# Patient Record
Sex: Female | Born: 1990 | Race: Black or African American | Hispanic: No | Marital: Single | Smoking: Never smoker
Health system: Southern US, Community
[De-identification: ages and names within clinical notes are randomized; demographics above are authoritative.]

## PROBLEM LIST (undated history)

## (undated) DIAGNOSIS — G43909 Migraine, unspecified, not intractable, without status migrainosus: Secondary | ICD-10-CM

## (undated) DIAGNOSIS — Z862 Personal history of diseases of the blood and blood-forming organs and certain disorders involving the immune mechanism: Secondary | ICD-10-CM

## (undated) DIAGNOSIS — F419 Anxiety disorder, unspecified: Secondary | ICD-10-CM

## (undated) DIAGNOSIS — E559 Vitamin D deficiency, unspecified: Secondary | ICD-10-CM

## (undated) DIAGNOSIS — N920 Excessive and frequent menstruation with regular cycle: Secondary | ICD-10-CM

## (undated) DIAGNOSIS — N946 Dysmenorrhea, unspecified: Secondary | ICD-10-CM

## (undated) DIAGNOSIS — R945 Abnormal results of liver function studies: Secondary | ICD-10-CM

## (undated) DIAGNOSIS — R7989 Other specified abnormal findings of blood chemistry: Secondary | ICD-10-CM

## (undated) DIAGNOSIS — D649 Anemia, unspecified: Secondary | ICD-10-CM

## (undated) DIAGNOSIS — R5381 Other malaise: Secondary | ICD-10-CM

## (undated) DIAGNOSIS — Z713 Dietary counseling and surveillance: Secondary | ICD-10-CM

## (undated) DIAGNOSIS — R5383 Other fatigue: Secondary | ICD-10-CM

## (undated) DIAGNOSIS — R002 Palpitations: Secondary | ICD-10-CM

## (undated) DIAGNOSIS — E669 Obesity, unspecified: Secondary | ICD-10-CM

## (undated) HISTORY — DX: Personal history of diseases of the blood and blood-forming organs and certain disorders involving the immune mechanism: Z86.2

## (undated) HISTORY — DX: Anemia, unspecified: D64.9

## (undated) HISTORY — DX: Obesity, unspecified: E66.9

## (undated) HISTORY — DX: Dysmenorrhea, unspecified: N94.6

## (undated) HISTORY — DX: Excessive and frequent menstruation with regular cycle: N92.0

## (undated) HISTORY — DX: Migraine, unspecified, not intractable, without status migrainosus: G43.909

## (undated) HISTORY — DX: Vitamin D deficiency, unspecified: E55.9

## (undated) HISTORY — DX: Palpitations: R00.2

## (undated) HISTORY — DX: Dietary counseling and surveillance: Z71.3

## (undated) HISTORY — DX: Other malaise: R53.81

## (undated) HISTORY — DX: Other specified abnormal findings of blood chemistry: R79.89

## (undated) HISTORY — DX: Abnormal results of liver function studies: R94.5

## (undated) HISTORY — DX: Anxiety disorder, unspecified: F41.9

## (undated) HISTORY — DX: Other fatigue: R53.83

## (undated) HISTORY — PX: OTHER SURGICAL HISTORY: SHX169

## (undated) NOTE — *Deleted (*Deleted)
I value your feedback and entrusting us with your care. If you get a [facility] patient survey, I would appreciate you taking the time to let us know about your experience today. Thank you!  As of October 24, 2019, your lab results will be released to your MyChart immediately, before I even have a chance to see them. Please give me time to review them and contact you if there are any abnormalities. Thank you for your patience.  

## (undated) NOTE — *Deleted (*Deleted)
I value your feedback and entrusting us with your care. If you get a [facility] patient survey, I would appreciate you taking the time to let us know about your experience today. Thank you!  As of October 24, 2019, your lab results will be released to your MyChart immediately, before I even have a chance to see them. Please give me time to review them and contact you if there are any abnormalities. Thank you for your patience.  

---

## 2011-11-26 ENCOUNTER — Emergency Department: Payer: Self-pay | Admitting: Emergency Medicine

## 2012-04-30 ENCOUNTER — Ambulatory Visit: Payer: Self-pay | Admitting: Oncology

## 2012-05-14 ENCOUNTER — Ambulatory Visit: Payer: Self-pay | Admitting: Oncology

## 2012-06-11 LAB — CBC CANCER CENTER
Bands: 1 %
Eosinophil: 3 %
HCT: 36.3 % (ref 35.0–47.0)
HGB: 11.7 g/dL — ABNORMAL LOW (ref 12.0–16.0)
Lymphocytes: 44 %
MCH: 24.7 pg — ABNORMAL LOW (ref 26.0–34.0)
MCHC: 32.2 g/dL (ref 32.0–36.0)
MCV: 77 fL — ABNORMAL LOW (ref 80–100)
Monocytes: 5 %
Platelet: 235 x10 3/mm (ref 150–440)
RBC: 4.72 10*6/uL (ref 3.80–5.20)
RDW: 27 % — ABNORMAL HIGH (ref 11.5–14.5)
Segmented Neutrophils: 45 %
Variant Lymphocyte: 2 %
WBC: 4.1 x10 3/mm (ref 3.6–11.0)

## 2012-06-11 LAB — FERRITIN: Ferritin (ARMC): 105 ng/mL (ref 8–388)

## 2012-06-11 LAB — IRON AND TIBC
Iron Bind.Cap.(Total): 281 ug/dL (ref 250–450)
Iron Saturation: 19 %
Iron: 53 ug/dL (ref 50–170)
Unbound Iron-Bind.Cap.: 228 ug/dL

## 2012-06-11 LAB — FOLATE: Folic Acid: 10.3 ng/mL (ref 3.1–100.0)

## 2012-06-11 LAB — RETICULOCYTES
Absolute Retic Count: 0.0378 10*6/uL (ref 0.023–0.096)
Reticulocyte: 0.8 % (ref 0.5–2.2)

## 2012-06-11 LAB — LACTATE DEHYDROGENASE: LDH: 139 U/L (ref 81–234)

## 2012-06-13 LAB — PROT IMMUNOELECTROPHORES(ARMC)

## 2012-06-14 ENCOUNTER — Ambulatory Visit: Payer: Self-pay | Admitting: Oncology

## 2012-09-11 ENCOUNTER — Ambulatory Visit: Payer: Self-pay | Admitting: Oncology

## 2012-09-11 LAB — CBC CANCER CENTER
Basophil #: 0 x10 3/mm (ref 0.0–0.1)
Basophil %: 0.6 %
Eosinophil #: 0.1 x10 3/mm (ref 0.0–0.7)
Eosinophil %: 1.4 %
HCT: 36.8 % (ref 35.0–47.0)
HGB: 11.6 g/dL — ABNORMAL LOW (ref 12.0–16.0)
Lymphocyte #: 2 x10 3/mm (ref 1.0–3.6)
Lymphocyte %: 42.1 %
MCH: 26.7 pg (ref 26.0–34.0)
MCHC: 31.5 g/dL — ABNORMAL LOW (ref 32.0–36.0)
MCV: 85 fL (ref 80–100)
Monocyte #: 0.4 x10 3/mm (ref 0.2–0.9)
Monocyte %: 7.7 %
Neutrophil #: 2.2 x10 3/mm (ref 1.4–6.5)
Neutrophil %: 48.2 %
Platelet: 186 x10 3/mm (ref 150–440)
RBC: 4.34 10*6/uL (ref 3.80–5.20)
RDW: 13.2 % (ref 11.5–14.5)
WBC: 4.7 x10 3/mm (ref 3.6–11.0)

## 2012-09-11 LAB — IRON AND TIBC
Iron Bind.Cap.(Total): 265 ug/dL (ref 250–450)
Iron Saturation: 32 %
Iron: 86 ug/dL (ref 50–170)
Unbound Iron-Bind.Cap.: 179 ug/dL

## 2012-09-11 LAB — FERRITIN: Ferritin (ARMC): 52 ng/mL (ref 8–388)

## 2012-09-14 ENCOUNTER — Ambulatory Visit: Payer: Self-pay | Admitting: Oncology

## 2012-12-15 ENCOUNTER — Ambulatory Visit: Payer: Self-pay | Admitting: Oncology

## 2013-07-18 ENCOUNTER — Ambulatory Visit: Payer: Self-pay | Admitting: Oncology

## 2013-07-18 LAB — CBC CANCER CENTER
Basophil #: 0.1 x10 3/mm (ref 0.0–0.1)
Basophil %: 0.8 %
Eosinophil #: 0.1 x10 3/mm (ref 0.0–0.7)
Eosinophil %: 0.9 %
HCT: 34.3 % — ABNORMAL LOW (ref 35.0–47.0)
HGB: 10.9 g/dL — ABNORMAL LOW (ref 12.0–16.0)
Lymphocyte #: 2.2 x10 3/mm (ref 1.0–3.6)
Lymphocyte %: 35.1 %
MCH: 24.1 pg — ABNORMAL LOW (ref 26.0–34.0)
MCHC: 31.8 g/dL — ABNORMAL LOW (ref 32.0–36.0)
MCV: 76 fL — ABNORMAL LOW (ref 80–100)
Monocyte #: 0.6 x10 3/mm (ref 0.2–0.9)
Monocyte %: 8.8 %
Neutrophil #: 3.4 x10 3/mm (ref 1.4–6.5)
Neutrophil %: 54.4 %
Platelet: 252 x10 3/mm (ref 150–440)
RBC: 4.54 10*6/uL (ref 3.80–5.20)
RDW: 15 % — ABNORMAL HIGH (ref 11.5–14.5)
WBC: 6.3 x10 3/mm (ref 3.6–11.0)

## 2013-07-18 LAB — FERRITIN: Ferritin (ARMC): 4 ng/mL — ABNORMAL LOW (ref 8–388)

## 2013-07-18 LAB — IRON AND TIBC
Iron Bind.Cap.(Total): 416 ug/dL (ref 250–450)
Iron Saturation: 15 %
Iron: 61 ug/dL (ref 50–170)
Unbound Iron-Bind.Cap.: 355 ug/dL

## 2013-08-14 ENCOUNTER — Ambulatory Visit: Payer: Self-pay | Admitting: Oncology

## 2014-03-21 LAB — HM PAP SMEAR: HM Pap smear: NORMAL

## 2014-03-27 LAB — LIPID PANEL
Cholesterol: 142 mg/dL (ref 0–200)
HDL: 66 mg/dL (ref 35–70)
LDL Cholesterol: 64 mg/dL
Triglycerides: 61 mg/dL (ref 40–160)

## 2014-04-01 ENCOUNTER — Ambulatory Visit: Payer: Self-pay | Admitting: Oncology

## 2014-04-14 ENCOUNTER — Ambulatory Visit: Payer: Self-pay | Admitting: Oncology

## 2014-07-08 ENCOUNTER — Encounter: Payer: Self-pay | Admitting: Cardiovascular Disease

## 2014-07-08 ENCOUNTER — Ambulatory Visit (INDEPENDENT_AMBULATORY_CARE_PROVIDER_SITE_OTHER): Payer: 59 | Admitting: Cardiovascular Disease

## 2014-07-08 VITALS — BP 110/80 | HR 59 | Ht 63.0 in | Wt 169.5 lb

## 2014-07-08 DIAGNOSIS — R002 Palpitations: Secondary | ICD-10-CM | POA: Insufficient documentation

## 2014-07-08 DIAGNOSIS — I471 Supraventricular tachycardia, unspecified: Secondary | ICD-10-CM

## 2014-07-08 DIAGNOSIS — R0602 Shortness of breath: Secondary | ICD-10-CM

## 2014-07-08 DIAGNOSIS — R079 Chest pain, unspecified: Secondary | ICD-10-CM

## 2014-07-08 DIAGNOSIS — R000 Tachycardia, unspecified: Secondary | ICD-10-CM

## 2014-07-08 NOTE — Patient Instructions (Signed)
You are likely having an atrial tachycardia or supraventricular tachycardia No medication changes were made.  We will order a holter monitor for palpitations, tachycardia We will call you with the results Please call the office if symptoms get worse  Please call us if you have new issues that need to be addressed before your next appt.

## 2014-07-08 NOTE — Assessment & Plan Note (Signed)
As above, etiology of her symptoms is not clear. Did suggest she talk with Dr. Carlynn Purl to see if there might be an alternative medication for birth control that she could take. Uncertain if this is playing any role but she does report it is the only medication she is taking and it is relatively new in the past several months.

## 2014-07-08 NOTE — Assessment & Plan Note (Signed)
Etiology of her symptoms is not clear. Unable to exclude atrial tachycardia,  sinus tachycardia, SVT. Symptoms are very short lived. Holter monitor has been ordered. We did discuss the difficulty in managing her symptoms as symptoms are very short-lived, blood pressure running low, heart rate running low which would make it difficult to take a beta blocker on a daily basis. We'll wait for the results of the Holter first before recommending any medication.

## 2014-07-08 NOTE — Progress Notes (Signed)
   Patient ID: Bethany Marshall, female    DOB: 11-30-1990, 23 y.o.   MRN: [id number]  HPI Comments: Bethany Marshall is a very pleasant 23 year old woman with history of menorrhagia, recently placed on birth control medication with improvement of her symptoms, iron deficiency anemia, low vitamin D who presents by referral from Dr. Carlynn Marshall for symptoms of palpitations/tachycardia.  She reports that symptoms started 3-4 months ago. She is uncertain if the birth control pill was started prior to her onset of symptoms. She has had increasing frequency of tachycardia. It will last for 3-5 seconds at a time, happens now at least twice per day. Typically unrelated to activities. She reports it is a very fast/rapid rhythm that occurs most commonly in the daytime, less at night. She denies any heavy caffeine use or other herbal supplements. No other new medications.  Denies any chest pain or shortness of breath with exertion. She works out at [employer] on a regular basis with no symptoms.  EKG shows normal sinus rhythm with rate 62 beats per minute, sinus arrhythmia noted TSH 1.5, vitamin D of 13 and, serum iron of 15 with ferritin of 7, total cholesterol 142, hematocrit 32   Outpatient Encounter Prescriptions as of 07/08/2014  Medication Sig  . desogestrel-ethinyl estradiol (ORTHO-CEPT, 28,) 0.15-30 MG-MCG tablet Take 1 tablet by mouth daily.  . naproxen (NAPROSYN) 500 MG tablet Take 500 mg by mouth 2 (two) times daily with a meal.  . Vitamin D, Ergocalciferol, (DRISDOL) 50000 UNITS CAPS capsule Take 50,000 Units by mouth every 7 (seven) days.     Review of Systems  Constitutional: Negative.   HENT: Negative.   Eyes: Negative.   Respiratory: Negative.   Cardiovascular: Positive for palpitations.       Episodes of tachycardia  Gastrointestinal: Negative.   Endocrine: Negative.   Musculoskeletal: Negative.   Skin: Negative.   Allergic/Immunologic: Negative.   Neurological: Negative.   Hematological: Negative.    Psychiatric/Behavioral: Negative.   All other systems reviewed and are negative.   BP 110/80  Pulse 59  Ht  (1.6 m)  Wt 169 lb 8 oz (76.885 kg)  BMI 30.03 kg/m2  Physical Exam  Nursing note and vitals reviewed. Constitutional: She is oriented to person, place, and time. She appears well-developed and well-nourished.  HENT:  Head: Normocephalic.  Nose: Nose normal.  Mouth/Throat: Oropharynx is clear and moist.  Eyes: Conjunctivae are normal. Pupils are equal, round, and reactive to light.  Neck: Normal range of motion. Neck supple. No JVD present.  Cardiovascular: Normal rate, regular rhythm, S1 normal, S2 normal, normal heart sounds and intact distal pulses.  Exam reveals no gallop and no friction rub.   No murmur heard. Pulmonary/Chest: Effort normal and breath sounds normal. No respiratory distress. She has no wheezes. She has no rales. She exhibits no tenderness.  Abdominal: Soft. Bowel sounds are normal. She exhibits no distension. There is no tenderness.  Musculoskeletal: Normal range of motion. She exhibits no edema and no tenderness.  Lymphadenopathy:    She has no cervical adenopathy.  Neurological: She is alert and oriented to person, place, and time. Coordination normal.  Skin: Skin is warm and dry. No rash noted. No erythema.  Psychiatric: She has a normal mood and affect. Her behavior is normal. Judgment and thought content normal.    Assessment and Plan

## 2014-07-18 DIAGNOSIS — R002 Palpitations: Secondary | ICD-10-CM

## 2014-07-18 DIAGNOSIS — R000 Tachycardia, unspecified: Secondary | ICD-10-CM

## 2014-07-25 ENCOUNTER — Ambulatory Visit: Payer: Self-pay | Admitting: Oncology

## 2014-08-21 ENCOUNTER — Telehealth: Payer: Self-pay

## 2014-08-21 NOTE — Telephone Encounter (Signed)
Reviewed results of pt's holter monitor w/ her:  "NSR w/ periods of sinus tachycardia.   Likely a normal heart rate response to exertion. No symptoms w/ diary entry."  Pt verbalizes understanding and will call back w/ any questions or concerns.

## 2014-09-17 ENCOUNTER — Other Ambulatory Visit: Payer: Self-pay

## 2014-09-17 ENCOUNTER — Encounter (INDEPENDENT_AMBULATORY_CARE_PROVIDER_SITE_OTHER): Payer: 59

## 2014-09-17 DIAGNOSIS — R079 Chest pain, unspecified: Secondary | ICD-10-CM

## 2014-09-17 DIAGNOSIS — R0602 Shortness of breath: Secondary | ICD-10-CM

## 2014-09-17 DIAGNOSIS — R002 Palpitations: Secondary | ICD-10-CM

## 2014-09-17 DIAGNOSIS — R000 Tachycardia, unspecified: Secondary | ICD-10-CM

## 2014-11-24 ENCOUNTER — Emergency Department: Payer: Self-pay | Admitting: Emergency Medicine

## 2015-06-19 ENCOUNTER — Emergency Department
Admission: EM | Admit: 2015-06-19 | Discharge: 2015-06-20 | Disposition: A | Discharge status: Home / Self Care | Payer: 59 | Attending: Emergency Medicine | Admitting: Emergency Medicine

## 2015-06-19 DIAGNOSIS — Y998 Other external cause status: Secondary | ICD-10-CM | POA: Diagnosis not present

## 2015-06-19 DIAGNOSIS — Z23 Encounter for immunization: Secondary | ICD-10-CM | POA: Diagnosis not present

## 2015-06-19 DIAGNOSIS — W452XXA Lid of can entering through skin, initial encounter: Secondary | ICD-10-CM | POA: Diagnosis not present

## 2015-06-19 DIAGNOSIS — S61211A Laceration without foreign body of left index finger without damage to nail, initial encounter: Secondary | ICD-10-CM | POA: Diagnosis present

## 2015-06-19 DIAGNOSIS — Y9389 Activity, other specified: Secondary | ICD-10-CM | POA: Diagnosis not present

## 2015-06-19 DIAGNOSIS — Y9289 Other specified places as the place of occurrence of the external cause: Secondary | ICD-10-CM | POA: Insufficient documentation

## 2015-06-19 MED ORDER — TETANUS-DIPHTH-ACELL PERTUSSIS 5-2.5-18.5 LF-MCG/0.5 IM SUSP
0.5000 mL | Freq: Once | INTRAMUSCULAR | Status: AC
  Administered 2015-06-19: 0.5 mL via INTRAMUSCULAR
  Filled 2015-06-19: qty 0.5

## 2015-06-19 MED ORDER — OXYCODONE-ACETAMINOPHEN 5-325 MG PO TABS
1.0000 | ORAL_TABLET | Freq: Once | ORAL | Status: AC
  Administered 2015-06-19: 1 via ORAL

## 2015-06-19 MED ORDER — LIDOCAINE HCL (PF) 1 % IJ SOLN
5.0000 mL | Freq: Once | INTRAMUSCULAR | Status: AC
Start: 2015-06-19 — End: 2015-06-19
  Administered 2015-06-19: 5 mL
  Filled 2015-06-19: qty 5

## 2015-06-19 MED ORDER — OXYCODONE-ACETAMINOPHEN 5-325 MG PO TABS
ORAL_TABLET | ORAL | Status: AC
  Filled 2015-06-19: qty 1

## 2015-06-19 NOTE — ED Provider Notes (Signed)
CSN: [id number]     Arrival date & time 06/19/15  1944 History   First MD Initiated Contact with Patient 06/19/15 2259     Chief Complaint  Patient presents with  . Extremity Laceration     (Consider location/radiation/quality/duration/timing/severity/associated sxs/prior Treatment) HPI  24 year old female with laceration to the left index finger on the volar aspect of the middle phalanx. Patient was opening a can of green beans when the lid cut her left index finger. Laceration occurred just prior to arrival. Pain is mild. She denies any numbness or tingling. No loss of range of motion. Her tetanus is not up-to-date. No other injuries.  Past Medical History  Diagnosis Date  . Menorrhagia   . Anemia   . Vitamin D deficiency   . Anemia   . Migraine   . Dysmenorrhea    Past Surgical History  Procedure Laterality Date  . Iron fusion     Family History  Problem Relation Age of Onset  . Hypertension Father    History  Substance Use Topics  . Smoking status: Never Smoker   . Smokeless tobacco: Not on file  . Alcohol Use: Yes     Comment: Rare    OB History    No data available     Review of Systems  Constitutional: Negative for fever, chills, activity change and fatigue.  HENT: Negative for congestion, sinus pressure and sore throat.   Eyes: Negative for visual disturbance.  Respiratory: Negative for cough, chest tightness and shortness of breath.   Cardiovascular: Negative for chest pain and leg swelling.  Gastrointestinal: Negative for nausea, vomiting, abdominal pain and diarrhea.  Genitourinary: Negative for dysuria.  Musculoskeletal: Negative for arthralgias and gait problem.  Skin: Positive for wound. Negative for rash.  Neurological: Negative for weakness, numbness and headaches.  Hematological: Negative for adenopathy.  Psychiatric/Behavioral: Negative for behavioral problems, confusion and agitation.      Allergies  Review of patient's allergies indicates  no known allergies.  Home Medications   Prior to Admission medications   Medication Sig Start Date End Date Taking? Authorizing Provider  desogestrel-ethinyl estradiol (ORTHO-CEPT, 28,) 0.15-30 MG-MCG tablet Take 1 tablet by mouth daily.    Historical Provider, MD  naproxen (NAPROSYN) 500 MG tablet Take 500 mg by mouth 2 (two) times daily with a meal.    Historical Provider, MD  Vitamin D, Ergocalciferol, (DRISDOL) 50000 UNITS CAPS capsule Take 50,000 Units by mouth every 7 (seven) days.    Historical Provider, MD   BP 122/86 mmHg  Pulse 96  Temp(Src) 98.4 F (36.9 C) (Oral)  Resp 20  Ht 5\' 4"  (1.626 m)  Wt 175 lb (79.379 kg)  BMI 30.02 kg/m2  SpO2 97% Physical Exam  Constitutional: She is oriented to person, place, and time. She appears well-developed and well-nourished. No distress.  HENT:  Head: Normocephalic and atraumatic.  Mouth/Throat: Oropharynx is clear and moist.  Eyes: EOM are normal. Pupils are equal, round, and reactive to light. Right eye exhibits no discharge. Left eye exhibits no discharge.  Neck: Normal range of motion. Neck supple.  Cardiovascular: Normal rate, regular rhythm and intact distal pulses.   Pulmonary/Chest: Effort normal and breath sounds normal. No respiratory distress. She exhibits no tenderness.  Abdominal: Soft. She exhibits no distension. There is no tenderness.  Musculoskeletal: Normal range of motion. She exhibits no edema.  Left index finger with full flexion and extension. Sensation is intact. There is a transverse laceration along the volar aspect of the distal  middle phalanx. No foreign body seen.  Neurological: She is alert and oriented to person, place, and time. She has normal reflexes.  Skin: Skin is warm and dry.  Psychiatric: She has a normal mood and affect. Her behavior is normal. Thought content normal.    ED Course  Procedures (including critical care time) LACERATION REPAIR Performed by: Patience Musca Authorized  by: Patience Musca Consent: Verbal consent obtained. Risks and benefits: risks, benefits and alternatives were discussed Consent given by: patient Patient identity confirmed: provided demographic data Prepped and Draped in normal sterile fashion Wound explored  Laceration Location: Left index finger   Laceration Length: 2 cm  No Foreign Bodies seen or palpated  Anesthesia: local infiltration  Local anesthetic: lidocaine 1 % w/out epinephrine  Anesthetic total: 4 ml  Irrigation method: syringe Amount of cleaning: standard  Skin closure: 5-0 Nylon  Number of sutures: 5  Technique: Simple interrupted   Patient tolerance: Patient tolerated the procedure well with no immediate complications. Labs Review Labs Reviewed - No data to display  Imaging Review No results found.   EKG Interpretation None      MDM   Final diagnoses:  Laceration of left index finger w/o foreign body w/o damage to nail, initial encounter   24 year old female with simple laceration to left index finger. NVI Left upper extremity. No tendon deficits noted. Tetanus updated. Follow up with [facility] acute care in 7 days for suture removal    Evon Slack, PA-C 06/19/15 2342  Arnaldo Natal, MD 06/20/15 [phone]

## 2015-06-19 NOTE — Discharge Instructions (Signed)

## 2015-06-19 NOTE — ED Notes (Signed)
Pt to ED c/o laceration to L index finger.

## 2015-07-06 ENCOUNTER — Emergency Department
Admission: EM | Admit: 2015-07-06 | Discharge: 2015-07-06 | Disposition: A | Discharge status: Home / Self Care | Payer: 59 | Attending: Emergency Medicine | Admitting: Emergency Medicine

## 2015-07-06 ENCOUNTER — Encounter: Payer: Self-pay | Admitting: *Deleted

## 2015-07-06 DIAGNOSIS — Z4802 Encounter for removal of sutures: Secondary | ICD-10-CM | POA: Insufficient documentation

## 2015-07-06 DIAGNOSIS — Z793 Long term (current) use of hormonal contraceptives: Secondary | ICD-10-CM | POA: Insufficient documentation

## 2015-07-06 DIAGNOSIS — Z791 Long term (current) use of non-steroidal anti-inflammatories (NSAID): Secondary | ICD-10-CM | POA: Insufficient documentation

## 2015-07-06 NOTE — ED Notes (Signed)
Pt discharged home after verbalizing understanding of discharge instructions; nad noted. 

## 2015-07-06 NOTE — ED Notes (Signed)
Suture removal from left index finger

## 2015-07-06 NOTE — Discharge Instructions (Signed)

## 2015-07-06 NOTE — ED Provider Notes (Signed)
[facility] Emergency Department Provider Note  ____________________________________________  Time seen: On arrival  I have reviewed the triage vital signs and the nursing notes.   HISTORY  Chief Complaint Suture / Staple Removal  HPI Bethany Marshall is a 24 y.o. female who presents for suture removal. Patient had left index finger sutured approximately 2 weeks ago she was out of town so she has not presented for removal until today. Wound is well-healed and she has no complaints    Past Medical History  Diagnosis Date  . Menorrhagia   . Anemia   . Vitamin D deficiency   . Anemia   . Migraine   . Dysmenorrhea     Patient Active Problem List   Diagnosis Date Noted  . Palpitations 07/08/2014  . Paroxysmal supraventricular tachycardia 07/08/2014    Past Surgical History  Procedure Laterality Date  . Iron fusion      Current Outpatient Rx  Name  Route  Sig  Dispense  Refill  . desogestrel-ethinyl estradiol (ORTHO-CEPT, 28,) 0.15-30 MG-MCG tablet   Oral   Take 1 tablet by mouth daily.         . naproxen (NAPROSYN) 500 MG tablet   Oral   Take 500 mg by mouth 2 (two) times daily with a meal.         . Vitamin D, Ergocalciferol, (DRISDOL) 50000 UNITS CAPS capsule   Oral   Take 50,000 Units by mouth every 7 (seven) days.           Allergies Review of patient's allergies indicates no known allergies.  Family History  Problem Relation Age of Onset  . Hypertension Father     Social History Social History  Substance Use Topics  . Smoking status: Never Smoker   . Smokeless tobacco: None  . Alcohol Use: Yes     Comment: Rare     Review of Systems  Constitutional: Negative for fever.   Skin: Negative for rash.    ____________________________________________   PHYSICAL EXAM:  VITAL SIGNS: ED Triage Vitals  Enc Vitals Group     BP --      Pulse --      Resp --      Temp 07/06/15 1743 97.5 F (36.4 C)     Temp src --       SpO2 --      Weight --      Height --      Head Cir --      Peak Flow --      Pain Score --      Pain Loc --      Pain Edu? --      Excl. in GC? --      Constitutional: Alert and oriented. Well appearing and in no distress. Eyes: Conjunctivae are normal.  ENT   Head: Normocephalic and atraumatic.   Mouth/Throat: Mucous membranes are moist. Cardiovascular: Normal rate, regular rhythm.  Respiratory: Normal respiratory effort without tachypnea nor retractions.  Gastrointestinal: Soft and non-tender in all quadrants. No distention. There is no CVA tenderness. Musculoskeletal: Nontender with normal range of motion in all extremities. Neurologic:  Normal speech and language. No gross focal neurologic deficits are appreciated. Skin:  Skin is warm, dry and intact. No rash noted. Psychiatric: Mood and affect are normal. Patient exhibits appropriate insight and judgment.  ____________________________________________    LABS (pertinent positives/negatives)  Labs Reviewed - No data to display  ____________________________________________     ____________________________________________  RADIOLOGY I have personally reviewed any xrays that were ordered on this patient: None  ____________________________________________   PROCEDURES  Procedure(s) performed: 5 sutures removed without difficulty   ____________________________________________   INITIAL IMPRESSION / ASSESSMENT AND PLAN / ED COURSE  Pertinent labs & imaging results that were available during my care of the patient were reviewed by me and considered in my medical decision making (see chart for details).  Well-healed wound, sutures removed  ____________________________________________   FINAL CLINICAL IMPRESSION(S) / ED DIAGNOSES  Final diagnoses:  Visit for suture removal     Jene Every, MD 07/06/15 1845

## 2015-12-03 ENCOUNTER — Ambulatory Visit: Payer: Self-pay | Admitting: Family Medicine

## 2015-12-08 ENCOUNTER — Encounter: Payer: Self-pay | Admitting: Family Medicine

## 2015-12-08 ENCOUNTER — Ambulatory Visit (INDEPENDENT_AMBULATORY_CARE_PROVIDER_SITE_OTHER): Payer: PRIVATE HEALTH INSURANCE | Admitting: Family Medicine

## 2015-12-08 VITALS — BP 118/68 | HR 107 | Temp 98.3°F | Resp 18 | Ht 64.0 in | Wt 202.0 lb

## 2015-12-08 DIAGNOSIS — E669 Obesity, unspecified: Secondary | ICD-10-CM

## 2015-12-08 DIAGNOSIS — Z862 Personal history of diseases of the blood and blood-forming organs and certain disorders involving the immune mechanism: Secondary | ICD-10-CM

## 2015-12-08 DIAGNOSIS — R7989 Other specified abnormal findings of blood chemistry: Secondary | ICD-10-CM | POA: Diagnosis not present

## 2015-12-08 DIAGNOSIS — Z131 Encounter for screening for diabetes mellitus: Secondary | ICD-10-CM | POA: Diagnosis not present

## 2015-12-08 DIAGNOSIS — Z13220 Encounter for screening for lipoid disorders: Secondary | ICD-10-CM | POA: Diagnosis not present

## 2015-12-08 DIAGNOSIS — R945 Abnormal results of liver function studies: Secondary | ICD-10-CM | POA: Insufficient documentation

## 2015-12-08 DIAGNOSIS — R1033 Periumbilical pain: Secondary | ICD-10-CM

## 2015-12-08 DIAGNOSIS — R5383 Other fatigue: Secondary | ICD-10-CM

## 2015-12-08 DIAGNOSIS — E559 Vitamin D deficiency, unspecified: Secondary | ICD-10-CM | POA: Diagnosis not present

## 2015-12-08 DIAGNOSIS — Z113 Encounter for screening for infections with a predominantly sexual mode of transmission: Secondary | ICD-10-CM

## 2015-12-08 DIAGNOSIS — Z23 Encounter for immunization: Secondary | ICD-10-CM

## 2015-12-08 DIAGNOSIS — R002 Palpitations: Secondary | ICD-10-CM | POA: Insufficient documentation

## 2015-12-08 DIAGNOSIS — G43009 Migraine without aura, not intractable, without status migrainosus: Secondary | ICD-10-CM | POA: Insufficient documentation

## 2015-12-08 DIAGNOSIS — N920 Excessive and frequent menstruation with regular cycle: Secondary | ICD-10-CM | POA: Insufficient documentation

## 2015-12-08 DIAGNOSIS — N946 Dysmenorrhea, unspecified: Secondary | ICD-10-CM | POA: Insufficient documentation

## 2015-12-08 NOTE — Progress Notes (Signed)
Name: Bethany Marshall   MRN: [id number]    DOB: 10-20-1991   Date:12/08/2015       Progress Note  Subjective  Chief Complaint  Chief Complaint  Patient presents with  . Follow-up    Patient just has a new sexual partner and would like to be checked for STD's, denies any symptoms  . Abdominal Pain    HPI  Abdominal pain  : she has noticed right periumbilical  pain described as sharp, intense 8-10/10, that can last a couple of hours and resolves by itself. It is associated with nausea but no vomiting or diarrhea. Episodes  started 6 months and were occasional, however over the past two months episodes have been more frequent, occurring a few times a week. No associated fever, no change in bowel movement, she is not sure if related to meals. She has a history of elevated LFT's but never diagnosed with fatty liver. She states cycles are not as heavy but just as long and regular, LMP: 11/15/2015  History of heavy cycles and iron deficiency: cycles have not been very heavy, but still lasts 5-6 days, she stopped ocps. She is still taking iron supplementation, she has to see Hematologist in the past for iron infusion.   Obesity: she has gained 27 lbs in the past 5 months, she still goes to the gym, but usually only lifting, she states she drinks Herbal life usually for dinner, having cereal and eggs for breakfast, she is trying to eat fruit but only one serving per day. She has been feeling tired   STI screen: she is homosexual, she is in a new relationship, past 4 months and would like to be checked for STI's. She denies any rashes or vaginal symptoms, no dysuria, she has not been using any sexual toys.   Patient Active Problem List   Diagnosis Date Noted  . Dysmenorrhea 12/08/2015  . Migraine without aura and responsive to treatment 12/08/2015  . Awareness of heartbeats 12/08/2015  . Vitamin D deficiency 12/08/2015  . Abnormal LFTs 12/08/2015  . H/O iron deficiency anemia 12/08/2015  . Excessive  and frequent menstruation 12/08/2015  . Palpitations 07/08/2014  . Paroxysmal supraventricular tachycardia (HCC) 07/08/2014    Past Surgical History  Procedure Laterality Date  . Iron fusion      Family History  Problem Relation Age of Onset  . Hypertension Father   . Hypothyroidism Mother     Social History   Social History  . Marital Status: Single    Spouse Name: N/A  . Number of Children: N/A  . Years of Education: N/A   Occupational History  . Not on file.   Social History Main Topics  . Smoking status: Never Smoker   . Smokeless tobacco: Never Used  . Alcohol Use: 0.0 oz/week    0 Standard drinks or equivalent per week     Comment: Rare   . Drug Use: No  . Sexual Activity: Yes   Other Topics Concern  . Not on file   Social History Narrative     Current outpatient prescriptions:  .  cholecalciferol (VITAMIN D) 1000 units tablet, Take 1,000 Units by mouth daily., Disp: , Rfl:  .  ferrous sulfate 325 (65 FE) MG tablet, Take by mouth., Disp: , Rfl:   No Known Allergies   ROS  Ten systems reviewed and is negative except as mentioned in HPI   Objective  Filed Vitals:   12/08/15 1512  BP: 118/68  Pulse: 107  Temp: 98.3 F (36.8 C)  TempSrc: Oral  Resp: 18  Height:  (1.626 m)  Weight: 202 lb (91.627 kg)  SpO2: 98%    Body mass index is 34.66 kg/(m^2).  Physical Exam  Constitutional: Patient appears well-developed and well-nourished. Obese  No distress.  HEENT: head atraumatic, normocephalic, pupils equal and reactive to light, neck supple, throat within normal limits Cardiovascular: Normal rate, regular rhythm and normal heart sounds.  No murmur heard. No BLE edema. Pulmonary/Chest: Effort normal and breath sounds normal. No respiratory distress. Abdominal: Soft.  There is a palpable mass on periumbilical area - right side, hard to touch, and caused pain during exam. No hernias. No hepatomegaly  Psychiatric: Patient has a normal mood  and affect. behavior is normal. Judgment and thought content normal.  PHQ2/9: Depression screen PHQ 2/9 12/08/2015  Decreased Interest 0  Down, Depressed, Hopeless 0  PHQ - 2 Score 0     Fall Risk: Fall Risk  12/08/2015  Falls in the past year? No      Functional Status Survey: Is the patient deaf or have difficulty hearing?: No Does the patient have difficulty seeing, even when wearing glasses/contacts?: Yes (glasses) Does the patient have difficulty concentrating, remembering, or making decisions?: No Does the patient have difficulty walking or climbing stairs?: No Does the patient have difficulty dressing or bathing?: No Does the patient have difficulty doing errands alone such as visiting a doctor's office or shopping?: No   Assessment & Plan  1. Periumbilical  pain  - Comprehensive metabolic panel - CBC with Differential/Platelet - US Abdomen and pelvis - periumbilical pain with palpable mass  2. Needs flu shot  - Flu Vaccine QUAD 36+ mos PF IM (Fluarix & Fluzone Quad PF) -refused  3. Vitamin D deficiency  - VITAMIN D 25 Hydroxy (Vit-D Deficiency, Fractures)  4. Abnormal LFTs  - Comprehensive metabolic panel  5. H/O iron deficiency anemia  - Iron - Ferritin - CBC with Differential/Platelet  6. Other fatigue  - CBC with Differential/Platelet - TSH - Vitamin B12  7. Routine screening for STI (sexually transmitted infection)  - Chlamydia/Gonococcus/Trichomonas, NAA - HIV antibody - RPR  8. Screening for diabetes mellitus  - Hemoglobin A1c  9. Lipid panel  - Lipid panel

## 2015-12-09 ENCOUNTER — Other Ambulatory Visit: Payer: Self-pay | Admitting: Family Medicine

## 2015-12-10 ENCOUNTER — Other Ambulatory Visit: Payer: Self-pay | Admitting: Family Medicine

## 2015-12-11 ENCOUNTER — Other Ambulatory Visit: Payer: Self-pay | Admitting: Family Medicine

## 2015-12-11 ENCOUNTER — Telehealth: Payer: Self-pay | Admitting: Family Medicine

## 2015-12-11 DIAGNOSIS — R1033 Periumbilical pain: Secondary | ICD-10-CM

## 2015-12-11 DIAGNOSIS — R945 Abnormal results of liver function studies: Secondary | ICD-10-CM

## 2015-12-11 DIAGNOSIS — R7989 Other specified abnormal findings of blood chemistry: Secondary | ICD-10-CM

## 2015-12-11 DIAGNOSIS — R1900 Intra-abdominal and pelvic swelling, mass and lump, unspecified site: Secondary | ICD-10-CM

## 2015-12-11 LAB — CHLAMYDIA/GONOCOCCUS/TRICHOMONAS, NAA
Chlamydia by NAA: NEGATIVE
Gonococcus by NAA: NEGATIVE
Trich vag by NAA: NEGATIVE

## 2015-12-11 NOTE — Telephone Encounter (Signed)
I called patient and she states she has not had any of the blood work due to having a bill at [employer] [facility] [employer]. I informed patient to try the hospital or [facility] labs instead. Also [facility] is faxing [place] patient urine results now could you please print them once they come and send them to Dr. Carlynn Purl. Thanks!

## 2015-12-11 NOTE — Telephone Encounter (Signed)
Pt wants to know if her STD test has came back yet? Please advise pt.

## 2015-12-15 ENCOUNTER — Other Ambulatory Visit: Payer: Self-pay | Admitting: Family Medicine

## 2015-12-15 ENCOUNTER — Ambulatory Visit
Admission: RE | Admit: 2015-12-15 | Discharge: 2015-12-15 | Disposition: A | Discharge status: Home / Self Care | Payer: PRIVATE HEALTH INSURANCE | Source: Ambulatory Visit | Attending: Family Medicine | Admitting: Family Medicine

## 2015-12-15 DIAGNOSIS — R1033 Periumbilical pain: Secondary | ICD-10-CM

## 2015-12-15 DIAGNOSIS — R7989 Other specified abnormal findings of blood chemistry: Secondary | ICD-10-CM | POA: Insufficient documentation

## 2015-12-15 DIAGNOSIS — R945 Abnormal results of liver function studies: Secondary | ICD-10-CM

## 2015-12-15 DIAGNOSIS — R1900 Intra-abdominal and pelvic swelling, mass and lump, unspecified site: Secondary | ICD-10-CM

## 2015-12-15 DIAGNOSIS — R1031 Right lower quadrant pain: Secondary | ICD-10-CM

## 2015-12-15 DIAGNOSIS — J90 Pleural effusion, not elsewhere classified: Secondary | ICD-10-CM

## 2015-12-21 ENCOUNTER — Telehealth: Payer: Self-pay

## 2015-12-21 NOTE — Telephone Encounter (Signed)
Patient called wanting to know her results. After she verified her date of birth, results were reviewed and patient was informed that she needed to have a chest x-ray and another ultrasound so her ovaries can be viewed since they were not looked at the first time.  Spoke with Sheralyn Boatman @ 9:49am with scheduling. Patient is scheduled to have both ultrasounds on Friday, December 25, 2015 @ 2:30pm at [address]  Patient was informed of visit and the prep (start drinking 1hr before and be finished within and do not void).

## 2015-12-25 ENCOUNTER — Ambulatory Visit
Admission: RE | Admit: 2015-12-25 | Discharge: 2015-12-25 | Disposition: A | Discharge status: Home / Self Care | Payer: PRIVATE HEALTH INSURANCE | Source: Ambulatory Visit | Attending: Family Medicine | Admitting: Family Medicine

## 2015-12-25 ENCOUNTER — Other Ambulatory Visit: Payer: Self-pay | Admitting: Family Medicine

## 2015-12-25 DIAGNOSIS — J90 Pleural effusion, not elsewhere classified: Secondary | ICD-10-CM

## 2015-12-25 DIAGNOSIS — R1031 Right lower quadrant pain: Secondary | ICD-10-CM

## 2015-12-25 DIAGNOSIS — J948 Other specified pleural conditions: Secondary | ICD-10-CM | POA: Diagnosis not present

## 2015-12-25 DIAGNOSIS — R1900 Intra-abdominal and pelvic swelling, mass and lump, unspecified site: Secondary | ICD-10-CM

## 2016-02-05 ENCOUNTER — Encounter: Payer: Self-pay | Admitting: Family Medicine

## 2016-02-05 ENCOUNTER — Ambulatory Visit (INDEPENDENT_AMBULATORY_CARE_PROVIDER_SITE_OTHER): Payer: PRIVATE HEALTH INSURANCE | Admitting: Family Medicine

## 2016-02-05 VITALS — BP 116/64 | HR 90 | Temp 98.0°F | Resp 16 | Ht 64.0 in | Wt 199.1 lb

## 2016-02-05 DIAGNOSIS — N920 Excessive and frequent menstruation with regular cycle: Secondary | ICD-10-CM | POA: Diagnosis not present

## 2016-02-05 MED ORDER — MEDROXYPROGESTERONE ACETATE 10 MG PO TABS: 10.0000 mg | ORAL_TABLET | Freq: Every day | ORAL | Status: DC

## 2016-02-05 MED ORDER — NORGESTIMATE-ETH ESTRADIOL 0.25-35 MG-MCG PO TABS: 1.0000 | ORAL_TABLET | Freq: Every day | ORAL | Status: DC

## 2016-02-05 NOTE — Progress Notes (Signed)
Name: Bethany Marshall   MRN: [id number]    DOB: 1991-11-06   Date:02/05/2016       Progress Note  Subjective  Chief Complaint  Chief Complaint  Patient presents with  . Menorrhagia    Patient states she has not been on birth control in the past year due to changing jobs and insurances. But the birth control helped in the past for heavy cycles, patient is not sexual active at this time and states for the past 3 weeks has been having a constant and heavy menstrual cycle that has not stopped. Would like to talk about birth controls options to regulate her cycles.     HPI  Menorrhagia: She states she continues to have periumbilical pain but less often ( normal pelvic US and abdominal US ), she is concerned about her cycles again. Started to spot two months ago, and over the past 3 weeks cycles have been heavy, with some clots. Only mild cramping. She also has a history of iron deficiency anemia and gets iron infusion. She was unable to have labs done secondary to having a balance at [facility] - advised her to have it done today at [facility]. We will try Provera now and start ocp once she resumes bleeding. Not currently sexually active.   Patient Active Problem List   Diagnosis Date Noted  . Pleural effusion, right 12/15/2015  . Dysmenorrhea 12/08/2015  . Migraine without aura and responsive to treatment 12/08/2015  . Awareness of heartbeats 12/08/2015  . Vitamin D deficiency 12/08/2015  . Abnormal LFTs 12/08/2015  . H/O iron deficiency anemia 12/08/2015  . Excessive and frequent menstruation 12/08/2015  . Palpitations 07/08/2014  . Paroxysmal supraventricular tachycardia (HCC) 07/08/2014    Past Surgical History  Procedure Laterality Date  . Iron fusion      Family History  Problem Relation Age of Onset  . Hypertension Father   . Hypothyroidism Mother     Social History   Social History  . Marital Status: Single    Spouse Name: N/A  . Number of Children: N/A  . Years of Education: N/A    Occupational History  . Not on file.   Social History Main Topics  . Smoking status: Never Smoker   . Smokeless tobacco: Never Used  . Alcohol Use: 0.0 oz/week    0 Standard drinks or equivalent per week     Comment: Rare   . Drug Use: No  . Sexual Activity: Yes   Other Topics Concern  . Not on file   Social History Narrative     Current outpatient prescriptions:  .  cholecalciferol (VITAMIN D) 1000 units tablet, Take 1,000 Units by mouth daily. Reported on 02/05/2016, Disp: , Rfl:  .  ferrous sulfate 325 (65 FE) MG tablet, Take by mouth. Reported on 02/05/2016, Disp: , Rfl:  .  medroxyPROGESTERone (PROVERA) 10 MG tablet, Take 1 tablet (10 mg total) by mouth daily., Disp: 7 tablet, Rfl: 0 .  norgestimate-ethinyl estradiol (ORTHO-CYCLEN, 28,) 0.25-35 MG-MCG tablet, Take 1 tablet by mouth daily., Disp: 1 Package, Rfl: 5  No Known Allergies   ROS  Ten systems reviewed and is negative except as mentioned in HPI  Objective  Filed Vitals:   02/05/16 0937  BP: 116/64  Pulse: 90  Temp: 98 F (36.7 C)  TempSrc: Oral  Resp: 16  Height:  (1.626 m)  Weight: 199 lb 1.6 oz (90.311 kg)  SpO2: 98%    Body mass index is 34.16 kg/(m^2).  Physical Exam  Constitutional: Patient appears well-developed and well-nourished. Obese  No distress.  HEENT: head atraumatic, normocephalic, pupils equal and reactive to light,  neck supple, throat within normal limits Cardiovascular: Normal rate, regular rhythm and normal heart sounds.  No murmur heard. No BLE edema. Pulmonary/Chest: Effort normal and breath sounds normal. No respiratory distress. Abdominal: Abdominal: Soft. There is an are on periumbilical area - right side, harder to palpation and tender - normal US - no pelvic masses. It may be muscular   Psychiatric: Patient has a normal mood and affect. behavior is normal. Judgment and thought content normal.  Recent Results (from the past 2160 hour(s))   Chlamydia/Gonococcus/Trichomonas, NAA     Status: None   Collection Time: 12/09/15  4:02 AM  Result Value Ref Range   Chlamydia by NAA Negative Negative   Gonococcus by NAA Negative Negative   Trich vag by NAA Negative Negative      PHQ2/9: Depression screen PHQ 2/9 12/08/2015  Decreased Interest 0  Down, Depressed, Hopeless 0  PHQ - 2 Score 0     Fall Risk: Fall Risk  12/08/2015  Falls in the past year? No     Assessment & Plan  1. Excessive and frequent menstruation  Explained to the patient to have labs done also, start with Provera only and get Ortho-Cycle after she starts bleeding again. Call back if cycle does not stop with Provera - norgestimate-ethinyl estradiol (ORTHO-CYCLEN, 28,) 0.25-35 MG-MCG tablet; Take 1 tablet by mouth daily.  Dispense: 1 Package; Refill: 5 - medroxyPROGESTERone (PROVERA) 10 MG tablet; Take 1 tablet (10 mg total) by mouth daily.  Dispense: 7 tablet; Refill: 0

## 2016-02-24 ENCOUNTER — Telehealth: Payer: Self-pay

## 2016-02-24 NOTE — Telephone Encounter (Signed)
Patient wanted to come by to pick up her labs slip. Orders were printed and placed up front for pick up.

## 2016-02-29 ENCOUNTER — Other Ambulatory Visit
Admission: RE | Admit: 2016-02-29 | Discharge: 2016-02-29 | Disposition: A | Discharge status: Home / Self Care | Payer: PRIVATE HEALTH INSURANCE | Source: Ambulatory Visit | Attending: Family Medicine | Admitting: Family Medicine

## 2016-02-29 ENCOUNTER — Other Ambulatory Visit: Payer: Self-pay | Admitting: Family Medicine

## 2016-02-29 ENCOUNTER — Ambulatory Visit: Payer: PRIVATE HEALTH INSURANCE | Admitting: Family Medicine

## 2016-02-29 DIAGNOSIS — R1033 Periumbilical pain: Secondary | ICD-10-CM | POA: Diagnosis not present

## 2016-02-29 DIAGNOSIS — R7989 Other specified abnormal findings of blood chemistry: Secondary | ICD-10-CM | POA: Diagnosis not present

## 2016-02-29 DIAGNOSIS — Z131 Encounter for screening for diabetes mellitus: Secondary | ICD-10-CM | POA: Insufficient documentation

## 2016-02-29 DIAGNOSIS — D509 Iron deficiency anemia, unspecified: Secondary | ICD-10-CM

## 2016-02-29 LAB — COMPREHENSIVE METABOLIC PANEL
ALT: 18 U/L (ref 14–54)
AST: 22 U/L (ref 15–41)
Albumin: 3.7 g/dL (ref 3.5–5.0)
Alkaline Phosphatase: 53 U/L (ref 38–126)
Anion gap: 5 (ref 5–15)
BUN: 9 mg/dL (ref 6–20)
CO2: 25 mmol/L (ref 22–32)
Calcium: 8.8 mg/dL — ABNORMAL LOW (ref 8.9–10.3)
Chloride: 107 mmol/L (ref 101–111)
Creatinine, Ser: 0.93 mg/dL (ref 0.44–1.00)
GFR calc Af Amer: >60 mL/min (ref >60)
GFR calc non Af Amer: >60 mL/min (ref >60)
Glucose, Bld: 97 mg/dL (ref 65–99)
Potassium: 3.8 mmol/L (ref 3.5–5.1)
Sodium: 137 mmol/L (ref 135–145)
Total Bilirubin: 0.2 mg/dL — ABNORMAL LOW (ref 0.3–1.2)
Total Protein: 7.6 g/dL (ref 6.5–8.1)

## 2016-02-29 LAB — CBC WITH DIFFERENTIAL/PLATELET
Basophils Absolute: 0 10*3/uL (ref 0–0.1)
Basophils Relative: 1 %
Eosinophils Absolute: 0.1 10*3/uL (ref 0–0.7)
Eosinophils Relative: 2 %
HCT: 28.5 % — ABNORMAL LOW (ref 35.0–47.0)
Hemoglobin: 8.9 g/dL — ABNORMAL LOW (ref 12.0–16.0)
Lymphocytes Relative: 40 %
Lymphs Abs: 1.9 10*3/uL (ref 1.0–3.6)
MCH: 21.8 pg — ABNORMAL LOW (ref 26.0–34.0)
MCHC: 31.1 g/dL — ABNORMAL LOW (ref 32.0–36.0)
MCV: 70 fL — ABNORMAL LOW (ref 80.0–100.0)
Monocytes Absolute: 0.4 10*3/uL (ref 0.2–0.9)
Monocytes Relative: 9 %
Neutro Abs: 2.3 10*3/uL (ref 1.4–6.5)
Neutrophils Relative %: 48 %
Platelets: 318 10*3/uL (ref 150–440)
RBC: 4.06 MIL/uL (ref 3.80–5.20)
RDW: 17.4 % — ABNORMAL HIGH (ref 11.5–14.5)
WBC: 4.6 10*3/uL (ref 3.6–11.0)

## 2016-02-29 LAB — IRON: Iron: 10 ug/dL — ABNORMAL LOW (ref 28–170)

## 2016-02-29 LAB — RAPID HIV SCREEN (HIV 1/2 AB+AG)
HIV 1/2 Antibodies: NONREACTIVE
HIV-1 P24 Antigen - HIV24: NONREACTIVE

## 2016-02-29 LAB — HEMOGLOBIN A1C: Hgb A1c MFr Bld: 5.6 % (ref 4.0–6.0)

## 2016-02-29 LAB — LIPID PANEL
Cholesterol: 161 mg/dL (ref 0–200)
HDL: 61 mg/dL (ref >40)
LDL Cholesterol: 88 mg/dL (ref 0–99)
Total CHOL/HDL Ratio: 2.6 RATIO
Triglycerides: 58 mg/dL (ref <150)
VLDL: 12 mg/dL (ref 0–40)

## 2016-02-29 LAB — FERRITIN: Ferritin: 3 ng/mL — ABNORMAL LOW (ref 11–307)

## 2016-02-29 LAB — TSH: TSH: 0.768 u[IU]/mL (ref 0.350–4.500)

## 2016-02-29 LAB — VITAMIN B12: Vitamin B-12: 363 pg/mL (ref 180–914)

## 2016-03-01 ENCOUNTER — Other Ambulatory Visit: Payer: Self-pay | Admitting: Family Medicine

## 2016-03-01 LAB — RPR: RPR Ser Ql: NONREACTIVE

## 2016-03-01 LAB — VITAMIN D 25 HYDROXY (VIT D DEFICIENCY, FRACTURES): Vit D, 25-Hydroxy: 15.5 ng/mL — ABNORMAL LOW (ref 30.0–100.0)

## 2016-03-01 MED ORDER — VITAMIN D (ERGOCALCIFEROL) 1.25 MG (50000 UNIT) PO CAPS: 50000.0000 [IU] | ORAL_CAPSULE | ORAL | Status: DC

## 2016-03-01 NOTE — Progress Notes (Signed)
[facility] and [facility], they will review notes and contact patient with appt. Details.

## 2016-03-02 ENCOUNTER — Ambulatory Visit (INDEPENDENT_AMBULATORY_CARE_PROVIDER_SITE_OTHER): Payer: PRIVATE HEALTH INSURANCE | Admitting: Emergency Medicine

## 2016-03-02 DIAGNOSIS — D638 Anemia in other chronic diseases classified elsewhere: Secondary | ICD-10-CM | POA: Diagnosis not present

## 2016-03-02 MED ORDER — CYANOCOBALAMIN 1000 MCG/ML IJ SOLN
1000.0000 ug | Freq: Once | INTRAMUSCULAR | Status: AC
  Administered 2016-03-02: 1000 ug via INTRAMUSCULAR

## 2016-03-02 MED ORDER — CYANOCOBALAMIN 1000 MCG/ML IJ SOLN: 1000.0000 ug | Freq: Once | INTRAMUSCULAR | Status: DC

## 2016-03-16 ENCOUNTER — Ambulatory Visit (INDEPENDENT_AMBULATORY_CARE_PROVIDER_SITE_OTHER): Payer: PRIVATE HEALTH INSURANCE | Admitting: Family Medicine

## 2016-03-16 ENCOUNTER — Encounter: Payer: Self-pay | Admitting: Family Medicine

## 2016-03-16 VITALS — BP 112/68 | HR 108 | Temp 98.1°F | Resp 18 | Ht 64.0 in | Wt 196.6 lb

## 2016-03-16 DIAGNOSIS — Z23 Encounter for immunization: Secondary | ICD-10-CM

## 2016-03-16 DIAGNOSIS — N920 Excessive and frequent menstruation with regular cycle: Secondary | ICD-10-CM | POA: Diagnosis not present

## 2016-03-16 DIAGNOSIS — D500 Iron deficiency anemia secondary to blood loss (chronic): Secondary | ICD-10-CM | POA: Diagnosis not present

## 2016-03-16 NOTE — Progress Notes (Signed)
Name: Bethany Marshall   MRN: [id number]    DOB: June 27, 1991   Date:03/16/2016       Progress Note  Subjective  Chief Complaint  Chief Complaint  Patient presents with  . excessive and frequent menstruation    patient stated that she in now regular    HPI  Menorrhagia: She states she continues to have periumbilical pain but less often ( normal pelvic US and abdominal US ), she was having spotting for about two months followed by heavy cycle for three weeks prior to her last visit in March at times with clots.   She was given Provera and cycles stopped and after withdraw bleeding she startedtaking ocp, and no bleeding since. She still feels tired. She has iron deficiency anemia, with a ferritin of 3. She got an appointment with hematologist at [facility] in July. She has not started taking iron pills yet , but explained the importance of taking supplementation and change her diet, to improve iron level.   Patient Active Problem List   Diagnosis Date Noted  . Dysmenorrhea 12/08/2015  . Migraine without aura and responsive to treatment 12/08/2015  . Awareness of heartbeats 12/08/2015  . Vitamin D deficiency 12/08/2015  . H/O iron deficiency anemia 12/08/2015  . Excessive and frequent menstruation 12/08/2015  . Palpitations 07/08/2014  . Paroxysmal supraventricular tachycardia ([place]) 07/08/2014    Past Surgical History  Procedure Laterality Date  . Iron fusion      Family History  Problem Relation Age of Onset  . Hypertension Father   . Hypothyroidism Mother     Social History   Social History  . Marital Status: Single    Spouse Name: N/A  . Number of Children: N/A  . Years of Education: N/A   Occupational History  . Not on file.   Social History Main Topics  . Smoking status: Never Smoker   . Smokeless tobacco: Never Used  . Alcohol Use: 0.0 oz/week    0 Standard drinks or equivalent per week     Comment: Rare   . Drug Use: No  . Sexual Activity: Yes   Other Topics Concern   . Not on file   Social History Narrative     Current outpatient prescriptions:  .  cholecalciferol (VITAMIN D) 1000 units tablet, Take 1,000 Units by mouth daily. Reported on 02/05/2016, Disp: , Rfl:  .  ferrous sulfate 325 (65 FE) MG tablet, Take by mouth. Reported on 02/05/2016, Disp: , Rfl:  .  norgestimate-ethinyl estradiol (ORTHO-CYCLEN, 28,) 0.25-35 MG-MCG tablet, Take 1 tablet by mouth daily., Disp: 1 Package, Rfl: 5 .  Vitamin D, Ergocalciferol, (DRISDOL) 50000 units CAPS capsule, Take 1 capsule (50,000 Units total) by mouth every 7 (seven) days., Disp: 12 capsule, Rfl: 0  No Known Allergies   ROS  Constitutional: Negative for fever , positive for mild  weight change.  Respiratory: Negative for cough and shortness of breath.   Cardiovascular: Negative for chest pain or palpitations.  Gastrointestinal: positive for mild periumbilical  Pain - that is intermittent - improves with icy hot, no bowel changes.  Musculoskeletal: Negative for gait problem or joint swelling.  Skin: Negative for rash.  Neurological: Negative for dizziness or headache.  No other specific complaints in a complete review of systems (except as listed in HPI above).  Objective  Filed Vitals:   03/16/16 0850  BP: 112/68  Pulse: 108  Temp: 98.1 F (36.7 C)  TempSrc: Oral  Resp: 18  Height: 5' 4"  (1.626  m)  Weight: 196 lb 9.6 oz (89.177 kg)  SpO2: 96%    Body mass index is 33.73 kg/(m^2).  Physical Exam  Constitutional: Patient appears well-developed and well-nourished. Obese  No distress.  HEENT: head atraumatic, normocephalic, pupils equal and reactive to light,  neck supple, throat within normal limits Cardiovascular: Normal rate, regular rhythm and normal heart sounds.  No murmur heard. No BLE edema. Pulmonary/Chest: Effort normal and breath sounds normal. No respiratory distress. Abdominal: Soft.  There is no tenderness. Psychiatric: Patient has a normal mood and affect. behavior is  normal. Judgment and thought content normal.  Recent Results (from the past 2160 hour(s))  Comprehensive metabolic panel     Status: Abnormal   Collection Time: 02/29/16  1:46 PM  Result Value Ref Range   Sodium 137 135 - 145 mmol/L   Potassium 3.8 3.5 - 5.1 mmol/L   Chloride 107 101 - 111 mmol/L   CO2 25 22 - 32 mmol/L   Glucose, Bld 97 65 - 99 mg/dL   BUN 9 6 - 20 mg/dL   Creatinine, Ser 0.93 0.44 - 1.00 mg/dL   Calcium 8.8 (L) 8.9 - 10.3 mg/dL   Total Protein 7.6 6.5 - 8.1 g/dL   Albumin 3.7 3.5 - 5.0 g/dL   AST 22 15 - 41 U/L   ALT 18 14 - 54 U/L   Alkaline Phosphatase 53 38 - 126 U/L   Total Bilirubin 0.2 (L) 0.3 - 1.2 mg/dL   GFR calc non Af Amer >60 >60 mL/min   GFR calc Af Amer >60 >60 mL/min    Comment: (NOTE) The eGFR has been calculated using the CKD EPI equation. This calculation has not been validated in all clinical situations. eGFR's persistently <60 mL/min signify possible Chronic Kidney Disease.    Anion gap 5 5 - 15  Hemoglobin A1c     Status: None   Collection Time: 02/29/16  1:46 PM  Result Value Ref Range   Hgb A1c MFr Bld 5.6 4.0 - 6.0 %  Iron     Status: Abnormal   Collection Time: 02/29/16  1:46 PM  Result Value Ref Range   Iron 10 (L) 28 - 170 ug/dL  Ferritin     Status: Abnormal   Collection Time: 02/29/16  1:46 PM  Result Value Ref Range   Ferritin 3 (L) 11 - 307 ng/mL  CBC with Differential/Platelet     Status: Abnormal   Collection Time: 02/29/16  1:46 PM  Result Value Ref Range   WBC 4.6 3.6 - 11.0 K/uL   RBC 4.06 3.80 - 5.20 MIL/uL   Hemoglobin 8.9 (L) 12.0 - 16.0 g/dL   HCT 28.5 (L) 35.0 - 47.0 %   MCV 70.0 (L) 80.0 - 100.0 fL   MCH 21.8 (L) 26.0 - 34.0 pg   MCHC 31.1 (L) 32.0 - 36.0 g/dL   RDW 17.4 (H) 11.5 - 14.5 %   Platelets 318 150 - 440 K/uL   Neutrophils Relative % 48 %   Neutro Abs 2.3 1.4 - 6.5 K/uL   Lymphocytes Relative 40 %   Lymphs Abs 1.9 1.0 - 3.6 K/uL   Monocytes Relative 9 %   Monocytes Absolute 0.4 0.2 - 0.9  K/uL   Eosinophils Relative 2 %   Eosinophils Absolute 0.1 0 - 0.7 K/uL   Basophils Relative 1 %   Basophils Absolute 0.0 0 - 0.1 K/uL  TSH     Status: None   Collection Time: 02/29/16  1:46 PM  Result Value Ref Range   TSH 0.768 0.350 - 4.500 uIU/mL  VITAMIN D 25 Hydroxy (Vit-D Deficiency, Fractures)     Status: Abnormal   Collection Time: 02/29/16  1:46 PM  Result Value Ref Range   Vit D, 25-Hydroxy 15.5 (L) 30.0 - 100.0 ng/mL    Comment: (NOTE) Vitamin D deficiency has been defined by the Institute of Medicine and an Endocrine Society practice guideline as a level of serum 25-OH vitamin D less than 20 ng/mL (1,2). The Endocrine Society went on to further define vitamin D insufficiency as a level between 21 and 29 ng/mL (2). 1. IOM (Institute of Medicine). 2010. Dietary reference   intakes for calcium and D. [place]: The   [employer]. 2. Holick MF, Binkley Welsh, Bischoff-Ferrari HA, et al.   Evaluation, treatment, and prevention of vitamin D   deficiency: an Endocrine Society clinical practice   guideline. JCEM. 2011 Jul; 96(7):1911-30. Performed At: [facility] [place], [place] [id number] Lindon Romp MD [id number]   Vitamin B12     Status: None   Collection Time: 02/29/16  1:46 PM  Result Value Ref Range   Vitamin B-12 363 180 - 914 pg/mL    Comment: (NOTE) This assay is not validated for testing neonatal or myeloproliferative syndrome specimens for Vitamin B12 levels. Performed at [facility]   Lipid panel     Status: None   Collection Time: 02/29/16  1:46 PM  Result Value Ref Range   Cholesterol 161 0 - 200 mg/dL   Triglycerides 58 <150 mg/dL   HDL 61 >40 mg/dL   Total CHOL/HDL Ratio 2.6 RATIO   VLDL 12 0 - 40 mg/dL   LDL Cholesterol 88 0 - 99 mg/dL    Comment:        Total Cholesterol/HDL:CHD Risk Coronary Heart Disease Risk Table                     Men   Women  1/2 Average Risk   3.4   3.3  Average  Risk       5.0   4.4  2 X Average Risk   9.6   7.1  3 X Average Risk  23.4   11.0        Use the calculated Patient Ratio above and the CHD Risk Table to determine the patient's CHD Risk.        ATP III CLASSIFICATION (LDL):  <100     mg/dL   Optimal  100-129  mg/dL   Near or Above                    Optimal  130-159  mg/dL   Borderline  160-189  mg/dL   High  >190     mg/dL   Very High   Rapid HIV screen (HIV 1/2 Ab+Ag)     Status: None   Collection Time: 02/29/16  1:46 PM  Result Value Ref Range   HIV-1 P24 Antigen - HIV24 NON REACTIVE NON REACTIVE   HIV 1/2 Antibodies NON REACTIVE NON REACTIVE   Interpretation (HIV Ag Ab)      A non reactive test result means that HIV 1 or HIV 2 antibodies and HIV 1 p24 antigen were not detected in the specimen.  RPR     Status: None   Collection Time: 02/29/16  1:46 PM  Result Value Ref Range   RPR Ser Ql  Non Reactive Non Reactive    Comment: (NOTE) Performed At: [facility] [place], [place] [id number] Lindon Romp MD [id number]      PHQ2/9: Depression screen [facility] 2/9 03/16/2016 12/08/2015  Decreased Interest 0 0  Down, Depressed, Hopeless 0 0  PHQ - 2 Score 0 0    Fall Risk: Fall Risk  03/16/2016 12/08/2015  Falls in the past year? No No    Functional Status Survey: Is the patient deaf or have difficulty hearing?: No Does the patient have difficulty seeing, even when wearing glasses/contacts?: No Does the patient have difficulty concentrating, remembering, or making decisions?: No Does the patient have difficulty walking or climbing stairs?: No Does the patient have difficulty dressing or bathing?: No Does the patient have difficulty doing errands alone such as visiting a doctor's office or shopping?: No    Assessment & Plan  1. Excessive and frequent menstruation  Resolved with ocp's   2. Iron deficiency anemia due to chronic blood loss  Needs to take supplementation and keep follow up  with hematologits - Hemoglobin and hematocrit, blood - Ferritin

## 2016-03-16 NOTE — Patient Instructions (Signed)
Iron-Rich Diet Iron is a mineral that helps your body to produce hemoglobin. Hemoglobin is a protein in your red blood cells that carries oxygen to your body's tissues. Eating too little iron may cause you to feel weak and tired, and it can increase your risk for infection. Eating enough iron is necessary for your body's metabolism, muscle function, and nervous system. Iron is naturally found in many foods. It can also be added to foods or fortified in foods. There are two types of dietary iron:  Heme iron. Heme iron is absorbed by the body more easily than nonheme iron. Heme iron is found in meat, poultry, and fish.  Nonheme iron. Nonheme iron is found in dietary supplements, iron-fortified grains, beans, and vegetables. You may need to follow an iron-rich diet if:  You have been diagnosed with iron deficiency or iron-deficiency anemia.  You have a condition that prevents you from absorbing dietary iron, such as:  Infection in your intestines.  Celiac disease. This involves long-lasting (chronic) inflammation of your intestines.  You do not eat enough iron.  You eat a diet that is high in foods that impair iron absorption.  You have lost a lot of blood.  You have heavy bleeding during your menstrual cycle.  You are pregnant. WHAT IS MY PLAN? Your health care provider may help you to determine how much iron you need per day based on your condition. Generally, when a person consumes sufficient amounts of iron in the diet, the following iron needs are met:  Men.  56-2 years old: 11 mg per day.  12-73 years old: 8 mg per day.  Women.   56-4 years old: 15 mg per day.  35-31 years old: 18 mg per day.  Over 82 years old: 8 mg per day.  Pregnant women: 27 mg per day.  Breastfeeding women: 9 mg per day. WHAT DO I NEED TO KNOW ABOUT AN IRON-RICH DIET?  Eat fresh fruits and vegetables that are high in vitamin C along with foods that are high in iron. This will help increase  the amount of iron that your body absorbs from food, especially with foods containing nonheme iron. Foods that are high in vitamin C include oranges, peppers, tomatoes, and mango.  Take iron supplements only as directed by your health care provider. Overdose of iron can be life-threatening. If you were prescribed iron supplements, take them with orange juice or a vitamin C supplement.  Cook foods in pots and pans that are made from iron.   Eat nonheme iron-containing foods alongside foods that are high in heme iron. This helps to improve your iron absorption.   Certain foods and drinks contain compounds that impair iron absorption. Avoid eating these foods in the same meal as iron-rich foods or with iron supplements. These include:  Coffee, black tea, and red wine.  Milk, dairy products, and foods that are high in calcium.  Beans, soybeans, and peas.  Whole grains.  When eating foods that contain both nonheme iron and compounds that impair iron absorption, follow these tips to absorb iron better.   Soak beans overnight before cooking.  Soak whole grains overnight and drain them before using.  Ferment flours before baking, such as using yeast in bread dough. WHAT FOODS CAN I EAT? Grains Iron-fortified breakfast cereal. Iron-fortified whole-wheat bread. Enriched rice. Sprouted grains. Vegetables Spinach. Potatoes with skin. Green peas. Broccoli. Red and green bell peppers. Fermented vegetables. Fruits Prunes. Raisins. Oranges. Strawberries. Mango. Grapefruit. Meats and Other Protein  Sources Beef liver. Oysters. Beef. Shrimp. [place]. Chicken. [place]. Sardines. Chickpeas. Nuts. Tofu. Beverages Tomato juice. Fresh orange juice. Prune juice. Hibiscus tea. Fortified instant breakfast shakes. Condiments Tahini. Fermented soy sauce. Sweets and Desserts Black-strap molasses.  Other Wheat germ. The items listed above may not be a complete list of recommended foods or  beverages. Contact your dietitian for more options. WHAT FOODS ARE NOT RECOMMENDED? Grains Whole grains. Bran cereal. Bran flour. Oats. Vegetables Artichokes. Brussels sprouts. Kale. Fruits Blueberries. Raspberries. Strawberries. Figs. Meats and Other Protein Sources Soybeans. Products made from soy protein. Dairy Milk. Cream. Cheese. Yogurt. Cottage cheese. Beverages Coffee. Black tea. Red wine. Sweets and Desserts Cocoa. Chocolate. Ice cream. Other Basil. Oregano. Parsley. The items listed above may not be a complete list of foods and beverages to avoid. Contact your dietitian for more information.   This information is not intended to replace advice given to you by your health care provider. Make sure you discuss any questions you have with your health care provider.   Document Released: 06/14/2005 Document Revised: 11/21/2014 Document Reviewed: 05/28/2014 Elsevier Interactive Patient Education [employer].

## 2016-05-09 ENCOUNTER — Ambulatory Visit (INDEPENDENT_AMBULATORY_CARE_PROVIDER_SITE_OTHER): Payer: PRIVATE HEALTH INSURANCE

## 2016-05-09 DIAGNOSIS — D519 Vitamin B12 deficiency anemia, unspecified: Secondary | ICD-10-CM

## 2016-05-09 MED ORDER — CYANOCOBALAMIN 1000 MCG/ML IJ SOLN
1000.0000 ug | Freq: Once | INTRAMUSCULAR | Status: AC
  Administered 2016-05-09: 1000 ug via INTRAMUSCULAR

## 2016-05-16 DIAGNOSIS — D509 Iron deficiency anemia, unspecified: Secondary | ICD-10-CM | POA: Insufficient documentation

## 2016-06-16 ENCOUNTER — Ambulatory Visit: Payer: PRIVATE HEALTH INSURANCE | Admitting: Family Medicine

## 2016-08-05 IMAGING — CT CT HEAD WITHOUT CONTRAST
1 series · 16 of 30 positions shown, 20 images · non-contrast
Comparison: None.

CLINICAL DATA: Right-sided headache for 1 month which is worse this
morning. No known injury. Initial encounter.

EXAM:
CT HEAD WITHOUT CONTRAST
TECHNIQUE: Contiguous axial images were obtained from the base of the skull
through the vertex without intravenous contrast.

[Series 2: head wo · axial · 0.40mm/px · z∈[+309,+453]mm · 16 of 36 slices shown, 20 images]
[im 2/36  brain]
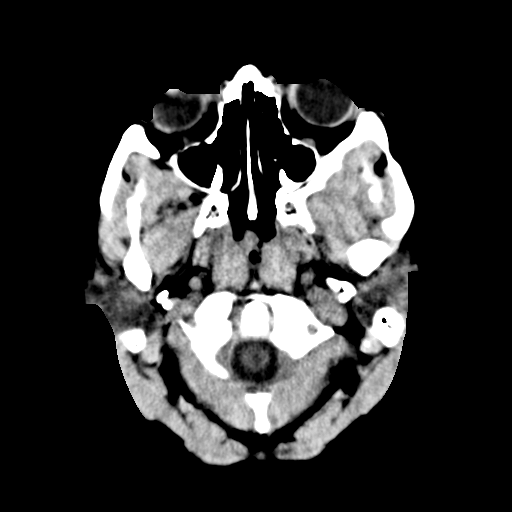
[im 2/36  bone]
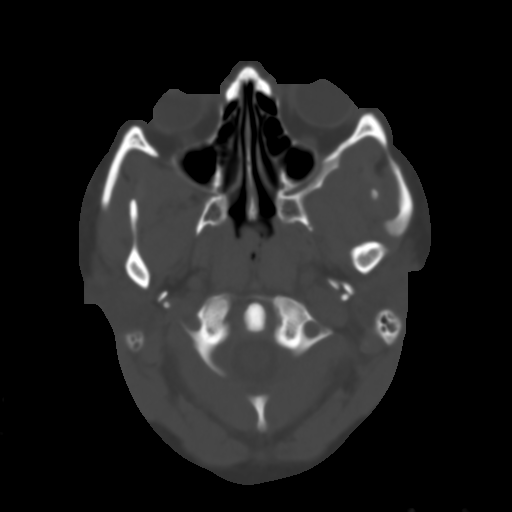
[im 4/36  brain]
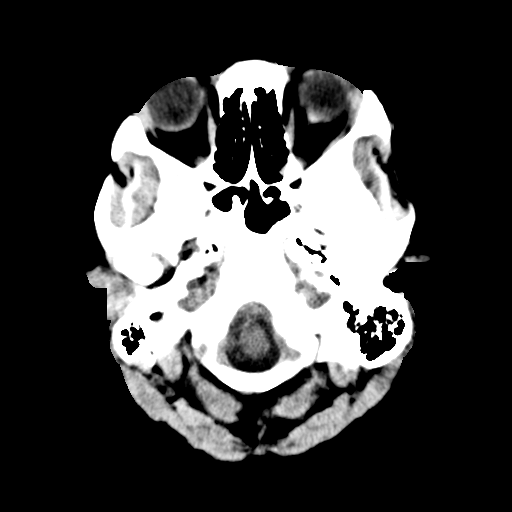
[im 7/36  brain]
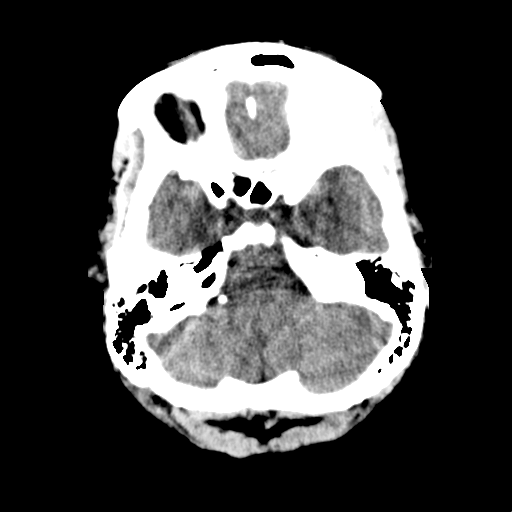
[im 9/36  brain]
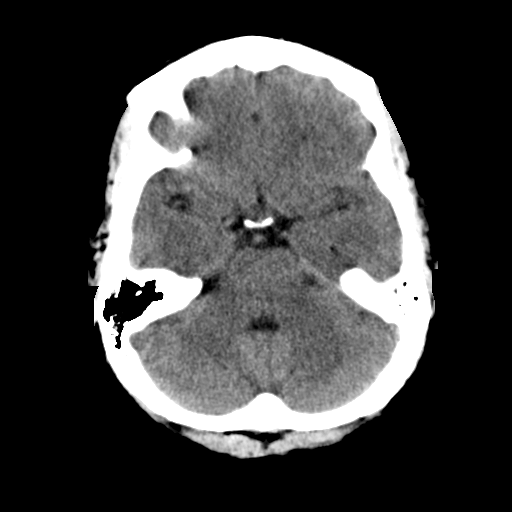
[im 10/36  brain]
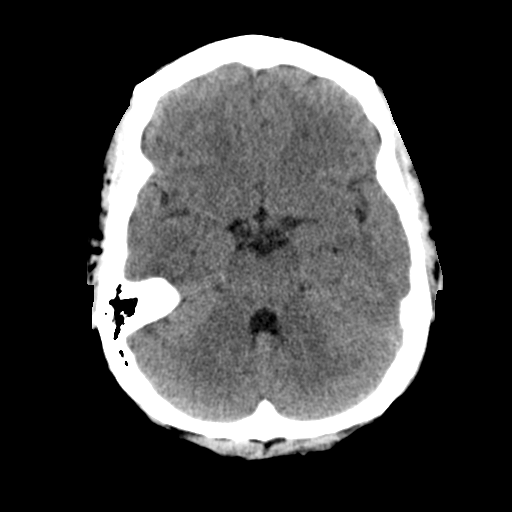
[im 10/36  bone]
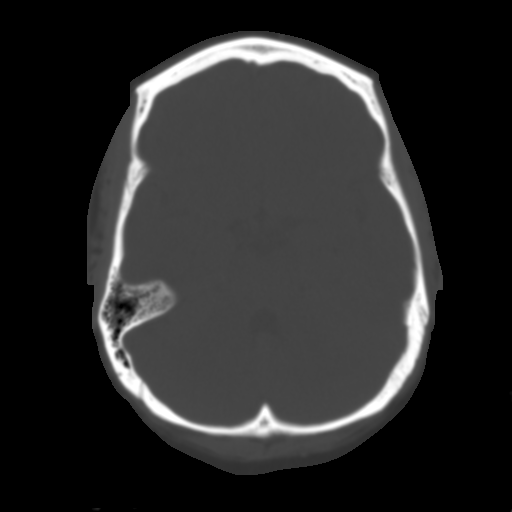
[im 13/36  brain]
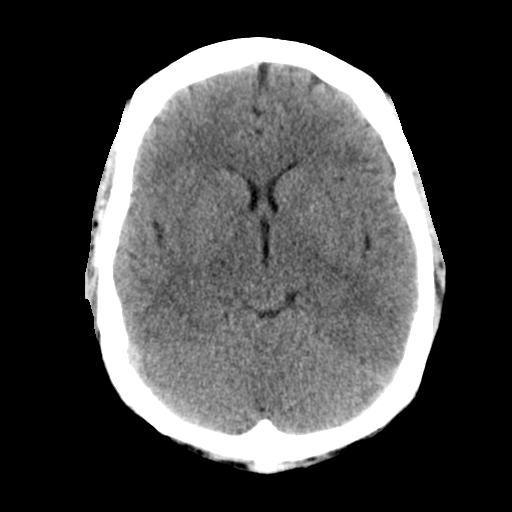
[im 15/36  brain]
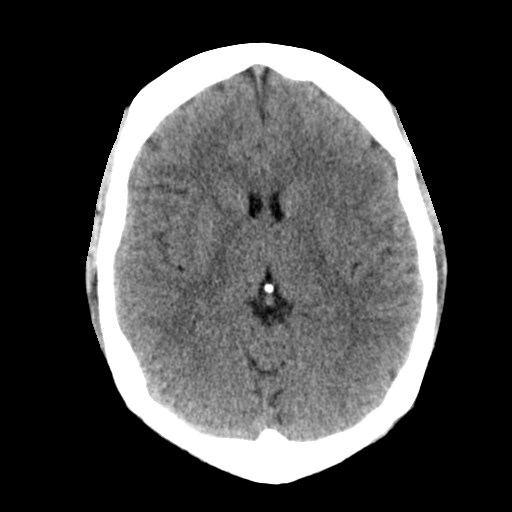
[im 17/36  brain]
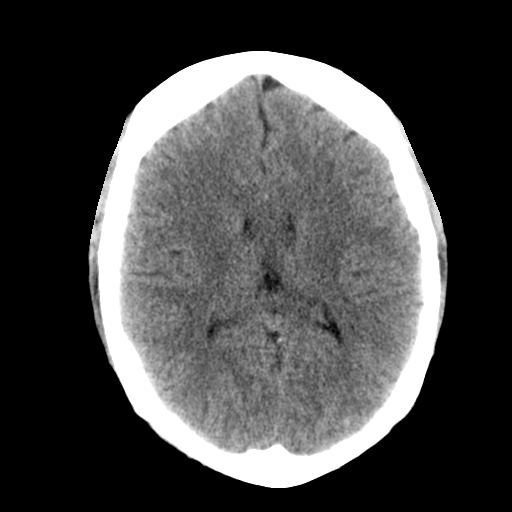
[im 19/36  brain]
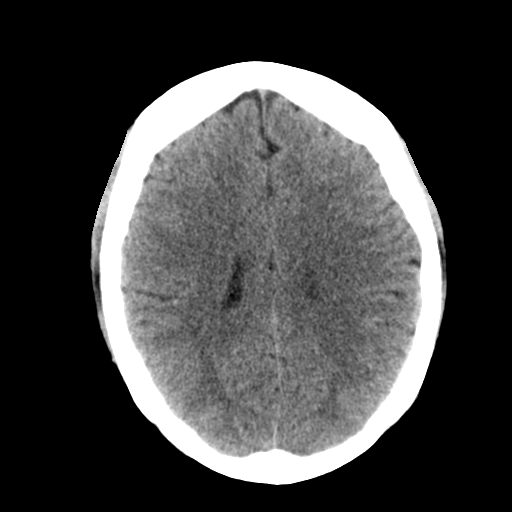
[im 19/36  bone]
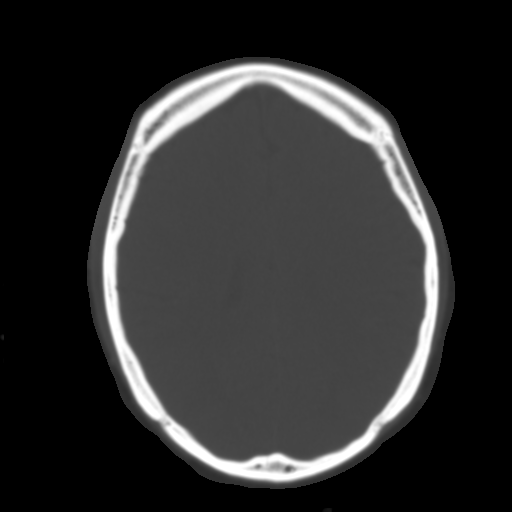
[im 21/36  brain]
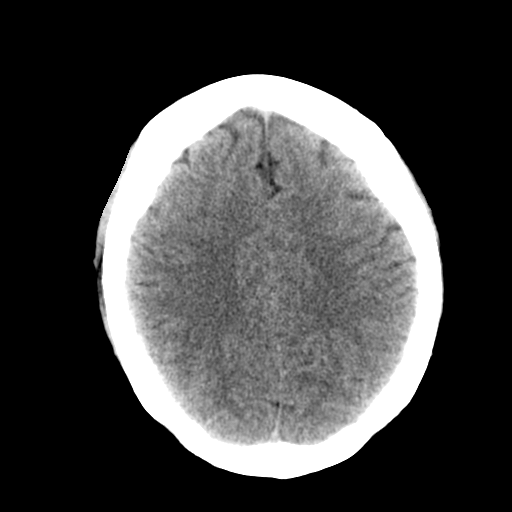
[im 23/36  brain]
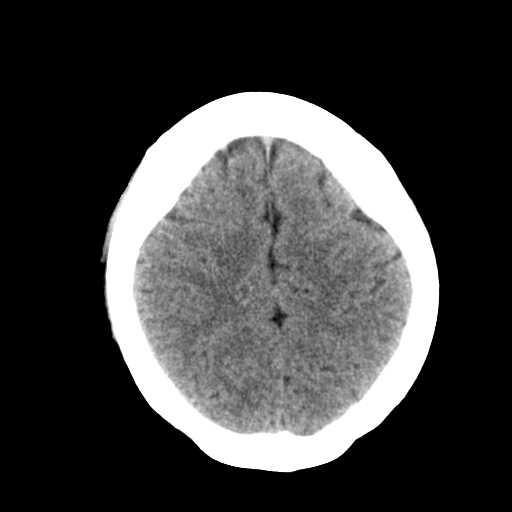
[im 26/36  brain]
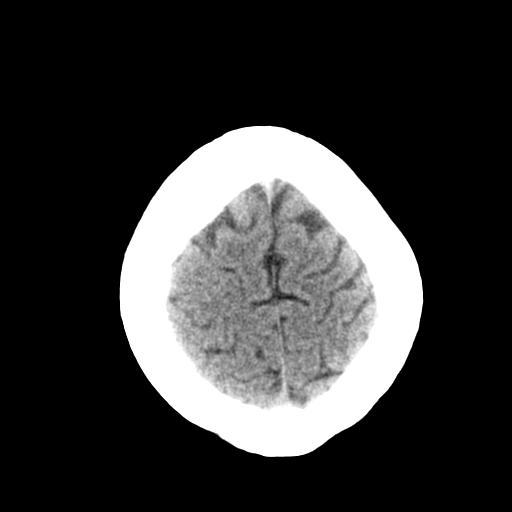
[im 27/36  brain]
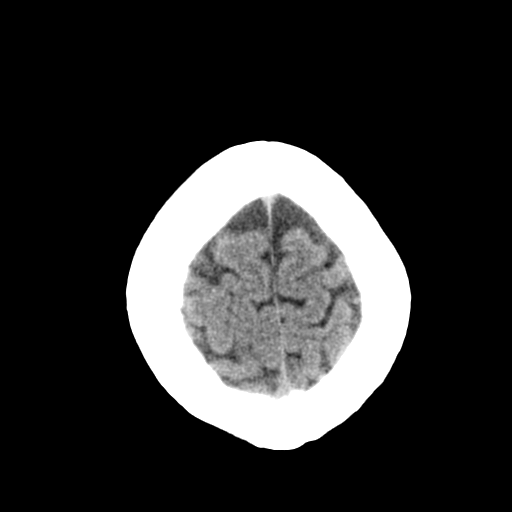
[im 27/36  bone]
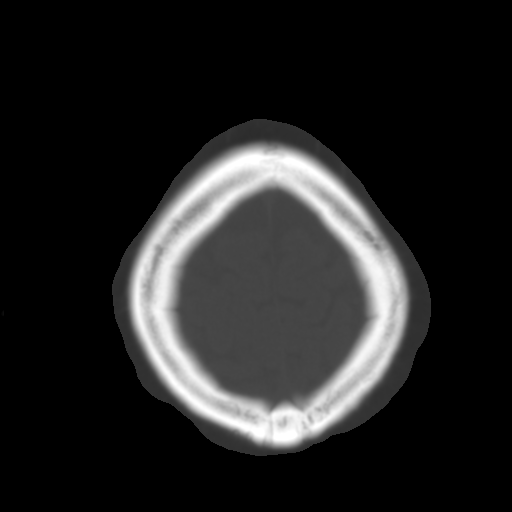
[im 29/36  brain]
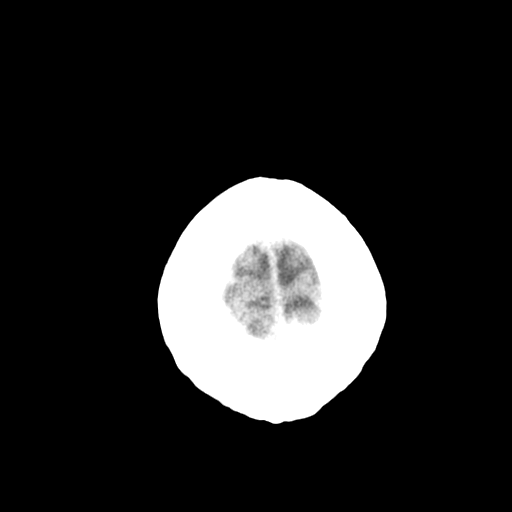
[im 32/36  brain]
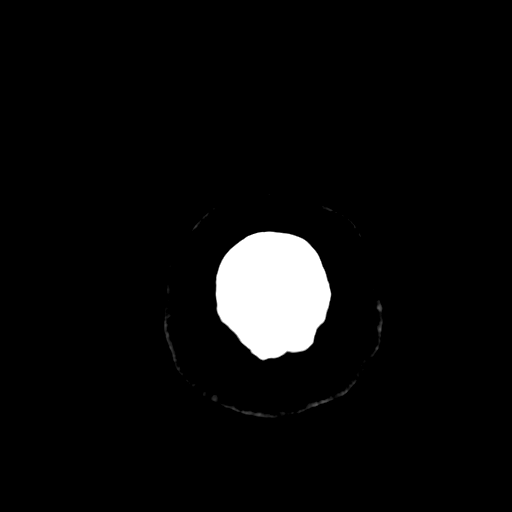
[im 34/36  brain]
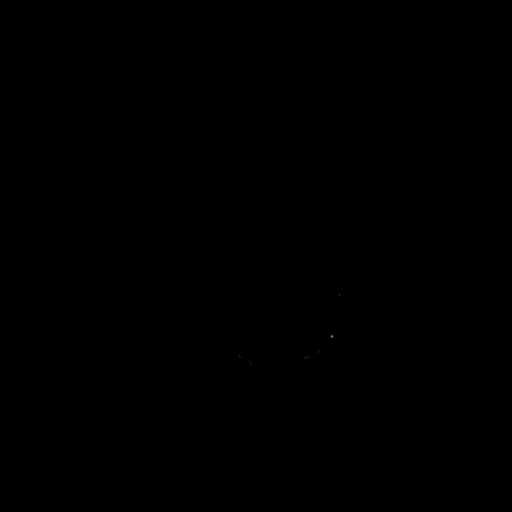

[16 of 30 positions shown; findings below may reference images not displayed]

FINDINGS: The brain appears normal without hemorrhage, infarct, mass lesion,
mass effect, midline shift or abnormal extra-axial fluid collection.
No hydrocephalus or pneumocephalus. Imaged paranasal sinuses and
mastoid air cells are clear.
IMPRESSION: Negative head CT.

## 2017-01-11 ENCOUNTER — Encounter: Payer: Self-pay | Admitting: Family Medicine

## 2017-01-11 ENCOUNTER — Ambulatory Visit (INDEPENDENT_AMBULATORY_CARE_PROVIDER_SITE_OTHER): Payer: PRIVATE HEALTH INSURANCE | Admitting: Family Medicine

## 2017-01-11 VITALS — BP 102/60 | HR 92 | Temp 98.3°F | Resp 16 | Ht 64.0 in | Wt 184.3 lb

## 2017-01-11 DIAGNOSIS — R1013 Epigastric pain: Secondary | ICD-10-CM

## 2017-01-11 DIAGNOSIS — E538 Deficiency of other specified B group vitamins: Secondary | ICD-10-CM

## 2017-01-11 DIAGNOSIS — Z131 Encounter for screening for diabetes mellitus: Secondary | ICD-10-CM | POA: Diagnosis not present

## 2017-01-11 DIAGNOSIS — D500 Iron deficiency anemia secondary to blood loss (chronic): Secondary | ICD-10-CM

## 2017-01-11 DIAGNOSIS — E559 Vitamin D deficiency, unspecified: Secondary | ICD-10-CM

## 2017-01-11 DIAGNOSIS — R634 Abnormal weight loss: Secondary | ICD-10-CM | POA: Diagnosis not present

## 2017-01-11 DIAGNOSIS — N920 Excessive and frequent menstruation with regular cycle: Secondary | ICD-10-CM

## 2017-01-11 DIAGNOSIS — Z23 Encounter for immunization: Secondary | ICD-10-CM

## 2017-01-11 DIAGNOSIS — Z113 Encounter for screening for infections with a predominantly sexual mode of transmission: Secondary | ICD-10-CM | POA: Diagnosis not present

## 2017-01-11 LAB — CBC WITH DIFFERENTIAL/PLATELET
Basophils Absolute: 0 cells/uL (ref 0–200)
Basophils Relative: 0 %
Eosinophils Absolute: 41 cells/uL (ref 15–500)
Eosinophils Relative: 1 %
HCT: 41.8 % (ref 35.0–45.0)
Hemoglobin: 13.2 g/dL (ref 11.7–15.5)
Lymphocytes Relative: 41 %
Lymphs Abs: 1681 cells/uL (ref 850–3900)
MCH: 26.1 pg — ABNORMAL LOW (ref 27.0–33.0)
MCHC: 31.6 g/dL — ABNORMAL LOW (ref 32.0–36.0)
MCV: 82.6 fL (ref 80.0–100.0)
MPV: 10.7 fL (ref 7.5–12.5)
Monocytes Absolute: 287 cells/uL (ref 200–950)
Monocytes Relative: 7 %
Neutro Abs: 2091 cells/uL (ref 1500–7800)
Neutrophils Relative %: 51 %
Platelets: 299 10*3/uL (ref 140–400)
RBC: 5.06 MIL/uL (ref 3.80–5.10)
RDW: 14.2 % (ref 11.0–15.0)
WBC: 4.1 10*3/uL (ref 3.8–10.8)

## 2017-01-11 LAB — COMPLETE METABOLIC PANEL WITH GFR
ALT: 12 U/L (ref 6–29)
AST: 18 U/L (ref 10–30)
Albumin: 4.1 g/dL (ref 3.6–5.1)
Alkaline Phosphatase: 57 U/L (ref 33–115)
BUN: 8 mg/dL (ref 7–25)
CO2: 28 mmol/L (ref 20–31)
Calcium: 9.5 mg/dL (ref 8.6–10.2)
Chloride: 104 mmol/L (ref 98–110)
Creat: 0.96 mg/dL (ref 0.50–1.10)
GFR, Est African American: >89 mL/min (ref >=60)
GFR, Est Non African American: 81 mL/min (ref >=60)
Glucose, Bld: 86 mg/dL (ref 65–99)
Potassium: 4.7 mmol/L (ref 3.5–5.3)
Sodium: 140 mmol/L (ref 135–146)
Total Bilirubin: 0.8 mg/dL (ref 0.2–1.2)
Total Protein: 7.2 g/dL (ref 6.1–8.1)

## 2017-01-11 LAB — IRON,TIBC AND FERRITIN PANEL
%SAT: 33 % (ref 11–50)
Ferritin: 157 ng/mL — ABNORMAL HIGH (ref 10–154)
Iron: 79 ug/dL (ref 40–190)
TIBC: 243 ug/dL — ABNORMAL LOW (ref 250–450)

## 2017-01-11 LAB — HIV ANTIBODY (ROUTINE TESTING W REFLEX): HIV 1&2 Ab, 4th Generation: NONREACTIVE

## 2017-01-11 LAB — VITAMIN B12: Vitamin B-12: 635 pg/mL (ref 200–1100)

## 2017-01-11 LAB — TSH: TSH: 1.49 mIU/L

## 2017-01-11 MED ORDER — OMEPRAZOLE 40 MG PO CPDR: 40.0000 mg | DELAYED_RELEASE_CAPSULE | Freq: Every day | ORAL | 3 refills | Status: DC

## 2017-01-11 MED ORDER — VITAMIN D (ERGOCALCIFEROL) 1.25 MG (50000 UNIT) PO CAPS: 50000.0000 [IU] | ORAL_CAPSULE | ORAL | 0 refills | Status: DC

## 2017-01-11 MED ORDER — CYANOCOBALAMIN 1000 MCG/ML IJ SOLN
1000.0000 ug | Freq: Once | INTRAMUSCULAR | Status: AC
  Administered 2017-01-11: 1000 ug via INTRAMUSCULAR

## 2017-01-11 NOTE — Progress Notes (Addendum)
Name: Bethany Marshall   MRN: [id number]    DOB: August 07, 1991   Date:01/11/2017       Progress Note  Subjective  Chief Complaint  Chief Complaint  Patient presents with  . Fatigue    Had a 8 hour Iron Infusion and could not tell a difference  . GI Problem    HPI  Epigastric pain: she has noticed abdominal discomfort for years and had US done one year ago that was normal. She states she has noticed decreased in appetite and sharp pain on epigastric area when she eats. Worse with tea, spicy food and alcohol. She denies vomiting, nausea or blood in stools. Abdominal pain is getting worse and more constant. She has been taking Ibuprofen prn for pain without help.   Iron deficiency anemia: going on for years. She has heavy cycles that lasts 7-8 days, she has seen Dr. Orlie Dakin and had iron infusion many times, last one Summer 2017. She is not taking iron supplementation. She is eating healthy. Discussed importance to stop her heavy cycles. 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   Fatigue: she has a history of iron deficiency anemia, vitamin D and B12 deficiency, we will check labs  Patient Active Problem List   Diagnosis Date Noted  . Iron deficiency anemia 05/16/2016  . Dysmenorrhea 12/08/2015  . Migraine without aura and responsive to treatment 12/08/2015  . Awareness of heartbeats 12/08/2015  . Vitamin D deficiency 12/08/2015  . Excessive and frequent menstruation 12/08/2015  . Palpitations 07/08/2014  . Paroxysmal supraventricular tachycardia (HCC) 07/08/2014    Past Surgical History:  Procedure Laterality Date  . iron fusion      Family History  Problem Relation Age of Onset  . Hypertension Father   . Hypothyroidism Mother     Social History   Social History  . Marital status: Single    Spouse name: N/A  .  Number of children: N/A  . Years of education: N/A   Occupational History  . Not on file.   Social History Main Topics  . Smoking status: Never Smoker  . Smokeless tobacco: Never Used  . Alcohol use 0.0 oz/week     Comment: Rare   . Drug use: No  . Sexual activity: Yes     Comment: homosexual    Other Topics Concern  . Not on file   Social History Narrative  . No narrative on file     Current Outpatient Prescriptions:  .  cholecalciferol (VITAMIN D) 1000 units tablet, Take 1,000 Units by mouth daily. Reported on 02/05/2016, Disp: , Rfl:  .  ferrous sulfate 325 (65 FE) MG tablet, Take by mouth. Reported on 02/05/2016, Disp: , Rfl:  .  Vitamin D, Ergocalciferol, (DRISDOL) 50000 units CAPS capsule, Take 1 capsule (50,000 Units total) by mouth every 7 (seven) days., Disp: 12 capsule, Rfl: 0  Current Facility-Administered Medications:  .  cyanocobalamin ((VITAMIN B-12)) injection 1,000 mcg, 1,000 mcg, Intramuscular, Once, Alba Cory, MD  No Known Allergies   ROS  Constitutional: Negative for fever, positive for weight change.  Respiratory: Negative for cough and shortness of breath.   Cardiovascular: Negative for chest pain or palpitations.  Gastrointestinal: Negative for abdominal pain, no bowel changes.  Musculoskeletal: Negative for gait problem or joint swelling.  Skin: Negative for  rash.  Neurological: Negative for dizziness or headache.  No other specific complaints in a complete review of systems (except as listed in HPI above).  Objective  Vitals:   01/11/17 0813  BP: 102/60  Pulse: 92  Resp: 16  Temp: 98.3 F (36.8 C)  TempSrc: Oral  SpO2: 96%  Weight: 184 lb 4.8 oz (83.6 kg)  Height: 5\' 4"  (1.626 m)    Body mass index is 31.64 kg/m.  Physical Exam  Constitutional: Patient appears well-developed and well-nourished. Obese  No distress.  HEENT: head atraumatic, normocephalic, pupils equal and reactive to light, neck supple, throat within normal  limits Cardiovascular: Normal rate, regular rhythm and normal heart sounds.  No murmur heard. No BLE edema. Pulmonary/Chest: Effort normal and breath sounds normal. No respiratory distress. Abdominal: Soft.  There is no  Tenderness, normal bowel sounds. Psychiatric: Patient has a normal mood and affect. behavior is normal. Judgment and thought content normal.  PHQ2/9: Depression screen [facility] 2/9 01/11/2017 03/16/2016 12/08/2015  Decreased Interest 0 0 0  Down, Depressed, Hopeless 0 0 0  PHQ - 2 Score 0 0 0     Fall Risk: Fall Risk  01/11/2017 03/16/2016 12/08/2015  Falls in the past year? No No No     Functional Status Survey: Is the patient deaf or have difficulty hearing?: No Does the patient have difficulty seeing, even when wearing glasses/contacts?: No Does the patient have difficulty concentrating, remembering, or making decisions?: No Does the patient have difficulty walking or climbing stairs?: No Does the patient have difficulty dressing or bathing?: No Does the patient have difficulty doing errands alone such as visiting a doctor's office or shopping?: No    Assessment & Plan  1. Iron deficiency anemia due to chronic blood loss  Seen by Dr. Orlie DakinFinnegan in the past, last infusion Summer 2017 and did not go back  - Iron, TIBC and Ferritin Panel - CBC with Differential/Platelet  2. Needs flu shot   Refused   3. Vitamin D deficiency  - Vitamin D, Ergocalciferol, (DRISDOL) 50000 units CAPS capsule; Take 1 capsule (50,000 Units total) by mouth every 7 (seven) days.  Dispense: 12 capsule; Refill: 0 - VITAMIN D 25 Hydroxy (Vit-D Deficiency, Fractures)  4. Screening for diabetes mellitus  - Hemoglobin A1c - Insulin, fasting  5. B12 deficiency  - cyanocobalamin ((VITAMIN B-12)) injection 1,000 mcg; Inject 1 mL (1,000 mcg total) into the muscle once. - Vitamin B12  6. Excessive and frequent menstruation  Explained that she needs to stop her cycles , she does not want to  take Depo or ocp's, discussed IUD and she is willing to try   7. Weight loss  - COMPLETE METABOLIC PANEL WITH GFR - TSH  8. Routine screening for STI (sexually transmitted infection)  - RPR - HIV antibody - GC Probe amplification, urine   9. Epigastric pain  Advised to avoid trigger foods, we will try PPI and recheck in one month - omeprazole (PRILOSEC) 40 MG capsule; Take 1 capsule (40 mg total) by mouth daily.  Dispense: 30 capsule; Refill: 3

## 2017-01-12 LAB — HEMOGLOBIN A1C
Hgb A1c MFr Bld: 4.9 % (ref <5.7)
Mean Plasma Glucose: 94 mg/dL

## 2017-01-12 LAB — GC/CHLAMYDIA PROBE AMP
CT Probe RNA: NOT DETECTED
GC Probe RNA: NOT DETECTED

## 2017-01-12 LAB — RPR

## 2017-01-12 LAB — INSULIN, FASTING: Insulin fasting, serum: 6.1 u[IU]/mL (ref 2.0–19.6)

## 2017-01-12 LAB — NEISSERIA GONORRHOEAE, PROBE AMP: GC Probe RNA: NOT DETECTED

## 2017-01-12 LAB — VITAMIN D 25 HYDROXY (VIT D DEFICIENCY, FRACTURES): Vit D, 25-Hydroxy: 21 ng/mL — ABNORMAL LOW (ref 30–100)

## 2017-02-08 ENCOUNTER — Ambulatory Visit: Payer: PRIVATE HEALTH INSURANCE | Admitting: Family Medicine

## 2017-03-10 ENCOUNTER — Encounter: Payer: Self-pay | Admitting: Certified Nurse Midwife

## 2017-03-10 ENCOUNTER — Other Ambulatory Visit: Payer: Self-pay | Admitting: Certified Nurse Midwife

## 2017-03-10 ENCOUNTER — Ambulatory Visit (INDEPENDENT_AMBULATORY_CARE_PROVIDER_SITE_OTHER): Payer: PRIVATE HEALTH INSURANCE | Admitting: Certified Nurse Midwife

## 2017-03-10 VITALS — BP 112/75 | HR 85 | Ht 64.0 in | Wt 185.5 lb

## 2017-03-10 DIAGNOSIS — N938 Other specified abnormal uterine and vaginal bleeding: Secondary | ICD-10-CM | POA: Diagnosis not present

## 2017-03-10 MED ORDER — MISOPROSTOL 200 MCG PO TABS: ORAL_TABLET | ORAL | 2 refills | Status: DC

## 2017-03-10 NOTE — Progress Notes (Signed)
GYN ENCOUNTER NOTE  Subjective:       Bethany Marshall is a 26 y.o. G0P0000 female here for gynecologic evaluation of abnormal uterine bleeding.   Bethany Marshall reports heavy, painful menstrual bleeding that requires her to change a super plus tampon every two (2) hours. She uses Excedrin extra strength sometimes, but is "not good at taking pills".   History significant for anemia requiring iron supplementation and infusion.   She was referred by her PCP, Dr. Steele Sizer. Last office visit 12/2016.   Denies difficulty breathing or respiratory distress, chest pain, abdominal pain, vaginal bleeding and leg pain or swelling.    Gynecologic History  Patient's last menstrual period was 02/24/2017.   Contraception: none; same sex relationship  Last Pap: 2015. Results were: normal  Period Cycle (Days): 28 Period Duration (Days): 5-7 Period Pattern: Regular Menstrual Flow: Heavy Dysmenorrhea: (!) Severe Dysmenorrhea Symptoms: Cramping, Nausea   Obstetric History OB History  Gravida Para Term Preterm AB Living  0 0 0 0 0 0  SAB TAB Ectopic Multiple Live Births  0 0 0 0 0        Past Medical History:  Diagnosis Date  . Anemia   . Anemia   . Anxiety   . Dysmenorrhea   . Elevated LFTs   . History of iron deficiency anemia   . Malaise and fatigue   . Menorrhagia   . Migraine   . Obesity   . Palpitation   . Vitamin D deficiency   . Weight loss counseling, encounter for     Past Surgical History:  Procedure Laterality Date  . iron fusion      Current Outpatient Prescriptions on File Prior to Visit  Medication Sig Dispense Refill  . cholecalciferol (VITAMIN D) 1000 units tablet Take 1,000 Units by mouth daily. Reported on 02/05/2016    . ferrous sulfate 325 (65 FE) MG tablet Take by mouth. Reported on 02/05/2016    . omeprazole (PRILOSEC) 40 MG capsule Take 1 capsule (40 mg total) by mouth daily. (Patient not taking: Reported on 03/10/2017) 30 capsule 3  . Vitamin D,  Ergocalciferol, (DRISDOL) 50000 units CAPS capsule Take 1 capsule (50,000 Units total) by mouth every 7 (seven) days. (Patient not taking: Reported on 03/10/2017) 12 capsule 0   No current facility-administered medications on file prior to visit.     No Known Allergies  Social History   Social History  . Marital status: Single    Spouse name: N/A  . Number of children: N/A  . Years of education: N/A   Occupational History  . Not on file.   Social History Main Topics  . Smoking status: Never Smoker  . Smokeless tobacco: Never Used  . Alcohol use 0.0 oz/week     Comment: Rare   . Drug use: No  . Sexual activity: Yes    Birth control/ protection: None     Comment: homosexual    Other Topics Concern  . Not on file   Social History Narrative  . No narrative on file    Family History  Problem Relation Age of Onset  . Hypertension Father   . Hypothyroidism Mother     The following portions of the patient's history were reviewed and updated as appropriate: allergies, current medications, past family history, past medical history, past social history, past surgical history and problem list.  Review of Systems  Review of Systems - Negative except as noted above.  History obtained from the patient.  Objective:   BP 112/75 (BP Location: Left Arm, Patient Position: Sitting, Cuff Size: Normal)   Pulse 85   Ht 5' 4"  (1.626 m)   Wt 185 lb 8 oz (84.1 kg)   LMP 02/24/2017   BMI 31.84 kg/m   CONSTITUTIONAL: Well-developed, well-nourished female in no acute distress.   ABDOMEN: Soft, non distended; Non tender.  No Organomegaly.  PELVIC:  External Genitalia: Normal  Vagina: Normal  Cervix: Normal  Uterus: Normal size, shape,consistency, mobile  Adnexa: Normal  LABS from PCP  Recent Results (from the past 2160 hour(s))  Iron, TIBC and Ferritin Panel     Status: Abnormal   Collection Time: 01/11/17  8:59 AM  Result Value Ref Range   Ferritin 157 (H) 10 - 154 ng/mL    Iron 79 40 - 190 ug/dL   TIBC 243 (L) 250 - 450 ug/dL   %SAT 33 11 - 50 %  CBC with Differential/Platelet     Status: Abnormal   Collection Time: 01/11/17  8:59 AM  Result Value Ref Range   WBC 4.1 3.8 - 10.8 K/uL   RBC 5.06 3.80 - 5.10 MIL/uL   Hemoglobin 13.2 11.7 - 15.5 g/dL   HCT 41.8 35.0 - 45.0 %   MCV 82.6 80.0 - 100.0 fL   MCH 26.1 (L) 27.0 - 33.0 pg   MCHC 31.6 (L) 32.0 - 36.0 g/dL   RDW 14.2 11.0 - 15.0 %   Platelets 299 140 - 400 K/uL   MPV 10.7 7.5 - 12.5 fL   Neutro Abs 2,091 1,500 - 7,800 cells/uL   Lymphs Abs 1,681 850 - 3,900 cells/uL   Monocytes Absolute 287 200 - 950 cells/uL   Eosinophils Absolute 41 15 - 500 cells/uL   Basophils Absolute 0 0 - 200 cells/uL   Neutrophils Relative % 51 %   Lymphocytes Relative 41 %   Monocytes Relative 7 %   Eosinophils Relative 1 %   Basophils Relative 0 %   Smear Review Criteria for review not met   COMPLETE METABOLIC PANEL WITH GFR     Status: None   Collection Time: 01/11/17  8:59 AM  Result Value Ref Range   Sodium 140 135 - 146 mmol/L   Potassium 4.7 3.5 - 5.3 mmol/L   Chloride 104 98 - 110 mmol/L   CO2 28 20 - 31 mmol/L   Glucose, Bld 86 65 - 99 mg/dL   BUN 8 7 - 25 mg/dL   Creat 0.96 0.50 - 1.10 mg/dL   Total Bilirubin 0.8 0.2 - 1.2 mg/dL   Alkaline Phosphatase 57 33 - 115 U/L   AST 18 10 - 30 U/L   ALT 12 6 - 29 U/L   Total Protein 7.2 6.1 - 8.1 g/dL   Albumin 4.1 3.6 - 5.1 g/dL   Calcium 9.5 8.6 - 10.2 mg/dL   GFR, Est African American >89 >=60 mL/min   GFR, Est Non African American 81 >=60 mL/min  Hemoglobin A1c     Status: None   Collection Time: 01/11/17  8:59 AM  Result Value Ref Range   Hgb A1c MFr Bld 4.9 <5.7 %    Comment:   For the purpose of screening for the presence of diabetes:   <5.7%       Consistent with the absence of diabetes 5.7-6.4 %   Consistent with increased risk for diabetes (prediabetes) >=6.5 %     Consistent with diabetes   This assay result is consistent with a  decreased risk of diabetes.   Currently, no consensus exists regarding use of hemoglobin A1c for diagnosis of diabetes in children.   According to American Diabetes Association (ADA) guidelines, hemoglobin A1c <7.0% represents optimal control in non-pregnant diabetic patients. Different metrics may apply to specific patient populations. Standards of Medical Care in Diabetes (ADA).      Mean Plasma Glucose 94 mg/dL  Insulin, fasting     Status: None   Collection Time: 01/11/17  8:59 AM  Result Value Ref Range   Insulin fasting, serum 6.1 2.0 - 19.6 uIU/mL    Comment:   This insulin assay shows strong cross-reactivity for some insulin analogs (lispro, aspart, and glargine) and much lower cross-reactivity with others (detemir, glulisine).   Stimulated Insulin reference intervals were established using the Siemens Immulite assay. These values are provided for general guidance only.   Vitamin B12     Status: None   Collection Time: 01/11/17  8:59 AM  Result Value Ref Range   Vitamin B-12 635 200 - 1,100 pg/mL  VITAMIN D 25 Hydroxy (Vit-D Deficiency, Fractures)     Status: Abnormal   Collection Time: 01/11/17  8:59 AM  Result Value Ref Range   Vit D, 25-Hydroxy 21 (L) 30 - 100 ng/mL    Comment: Vitamin D Status           25-OH Vitamin D        Deficiency                <20 ng/mL        Insufficiency         20 - 29 ng/mL        Optimal             > or = 30 ng/mL   For 25-OH Vitamin D testing on patients on D2-supplementation and patients for whom quantitation of D2 and D3 fractions is required, the [employer] 25-OH VIT D, (D2,D3), LC/MS/MS is recommended: order code [id number] (patients > 2 yrs).   TSH     Status: None   Collection Time: 01/11/17  8:59 AM  Result Value Ref Range   TSH 1.49 mIU/L    Comment:   Reference Range   > or = 20 Years  0.40-4.50   Pregnancy Range First trimester  0.26-2.66 Second trimester 0.55-2.73 Third trimester  0.43-2.91     RPR      Status: None   Collection Time: 01/11/17  8:59 AM  Result Value Ref Range   RPR Ser Ql NON REAC NON REAC  HIV antibody     Status: None   Collection Time: 01/11/17  8:59 AM  Result Value Ref Range   HIV 1&2 Ab, 4th Generation NONREACTIVE NONREACTIVE    Comment:   HIV-1 antigen and HIV-1/HIV-2 antibodies were not detected.  There is no laboratory evidence of HIV infection.   HIV-1/2 Antibody Diff        Not indicated. HIV-1 RNA, Qual TMA          Not indicated.     PLEASE NOTE: This information has been disclosed to you from records whose confidentiality may be protected by state law. If your state requires such protection, then the state law prohibits you from making any further disclosure of the information without the specific written consent of the person to whom it pertains, or as otherwise permitted by law. A general authorization for the release of medical or other information is NOT sufficient for this purpose.  The performance of this assay has not been clinically validated in patients less than 93 years old.   For additional information please refer to http://education.[facility].com/faq/FAQ106.  (This link is being provided for informational/educational purposes only.)     N. gonorrhoeae, RNA     Status: None   Collection Time: 01/11/17  8:59 AM  Result Value Ref Range   GC Probe RNA NOT DETECTED     Comment:                    **Normal Reference Range: NOT DETECTED**   This test was performed using the APTIMA COMBO2 Assay ([employer]   The analytical performance characteristics of this assay, when used to test SurePath specimens have been determined by [facility]     GC/Chlamydia Probe Amp     Status: None   Collection Time: 01/11/17  8:59 AM  Result Value Ref Range   CT Probe RNA NOT DETECTED     Comment:                    **Normal Reference Range: NOT DETECTED**   This test was performed using the APTIMA COMBO2 Assay  ([place].).   The analytical performance characteristics of this assay, when used to test SurePath specimens have been determined by [facility]      GC Probe RNA NOT DETECTED     Comment:                    **Normal Reference Range: NOT DETECTED**   This test was performed using the APTIMA COMBO2 Assay ([place].).   The analytical performance characteristics of this assay, when used to test SurePath specimens have been determined by [facility]      Assessment:   1. DUB (dysfunctional uterine bleeding) - NuSwab Vaginitis Plus (VG+)  Plan:   NuSwab collected  Discussed management options including NSAIDs, OCPs, Tranexamic acid, and hormonal IUDs. Risk and benefits of each reviewed.   Pt would like hormonal IUD. Literature given on [place]. Advised pt to return during next menstrual cycle for device insertion.   Rx Cytotec, see orders.   Reviewed red flag symptoms and when to call.   RTC x 4 weeks for IUD insertion.    Diona Fanti, CNM

## 2017-03-10 NOTE — Patient Instructions (Signed)
Intrauterine Device Insertion An intrauterine device (IUD) is a medical device that gets inserted into the uterus to prevent pregnancy. It is a small, T-shaped device that has one or two nylon strings hanging down from it. The strings hang out of the lower part of the uterus (cervix) to allow for future IUD removal. There are two types of IUDs available:  Copper IUD. This type of IUD has copper wire wrapped around it. Copper makes the uterus and fallopian tubes produce a fluid that kills sperm. A copper IUD may last up to 10 years.  Hormone IUD. This type of IUD is made of plastic and contains the hormone progestin (synthetic progesterone). The hormone thickens mucus in the cervix and prevents sperm from entering the uterus. It also thins the uterine lining to prevent implantation of a fertilized egg. The hormone can weaken or kill the sperm that get into the uterus. A hormone IUD may last 3-5 years.  Tell a health care provider about:  Any allergies you have.  All medicines you are taking, including vitamins, herbs, eye drops, creams, and over-the-counter medicines.  Any problems you or family members have had with anesthetic medicines.  Any blood disorders you have.  Any surgeries you have had.  Any medical conditions you have, including any STIs (sexually transmitted infections) you may have.  Whether you are pregnant or may be pregnant. What are the risks? Generally, this is a safe procedure. However, problems may occur, including:  Infection.  Bleeding.  Allergic reactions to medicines.  Accidental puncture (perforation) of the uterus, or damage to other structures or organs.  Accidental placement of the IUD either in the muscle layer of the uterus (myometrium) or outside the uterus.  The IUD falling out of the uterus (expulsion). This is more common among women who have recently had a child.  Pregnancy that happens in the fallopian tube (ectopic pregnancy).  Infection of  the uterus and fallopian tubes (pelvic inflammatory disease).  What happens before the procedure?  Schedule the IUD insertion for when you will have your menstrual period or right after, to make sure you are not pregnant. Placement of the IUD is better tolerated shortly after a menstrual cycle.  Follow instructions from your health care provider about eating or drinking restrictions.  Ask your health care provider about changing or stopping your regular medicines. This is especially important if you are taking diabetes medicines or blood thinners.  You may get a pain reliever to take before the procedure.  You may have tests for: ? Pregnancy. A pregnancy test involves having a urine sample taken. ? STIs. Placing an IUD in someone who has an STI can make the infection worse. ? Cervical cancer. You may have a Pap test to check for this type of cancer. This means collecting cells from your cervix to be examined under a microscope.  You may have a physical exam to determine the size and position of your uterus. The procedure may vary among health care providers and hospitals. What happens during the procedure?  A tool (speculum) will be placed in your vagina and widened so that your health care provider can see your cervix.  Medicine may be applied to your cervix to help lower your risk of infection (antiseptic medicine).  You may be given an anesthetic medicine to numb each side of your cervix (intracervical block or paracervical block). This medicine is usually given by an injection into the cervix.  A tool (uterine sound) will be inserted   into your uterus to determine the length of your uterus and the direction that your uterus may be tilted.  A slim instrument or tube (IUD inserter) that holds the IUD will be inserted into your vagina, through your cervical canal, and into your uterus.  The IUD will be placed in the uterus, and the IUD inserter will be removed.  The strings that are  attached to the IUD will be trimmed so that they lie just below the cervix. The procedure may vary among health care providers and hospitals. What happens after the procedure?  You may have bleeding after the procedure. This is normal. It varies from light bleeding (spotting) for a few days to menstrual-like bleeding.  You may have cramping and pain.  You may feel dizzy or light-headed.  You may have lower back pain. Summary  An intrauterine device (IUD) is a small, T-shaped device that has one or two nylon strings hanging down from it.  Two types of IUDs are available. You may have a copper IUD or a hormone IUD.  Schedule the IUD insertion for when you will have your menstrual period or right after, to make sure you are not pregnant. Placement of the IUD is better tolerated shortly after a menstrual cycle.  You may have bleeding after the procedure. This is normal. It varies from light spotting for a few days to menstrual-like bleeding. This information is not intended to replace advice given to you by your health care provider. Make sure you discuss any questions you have with your health care provider. Document Released: 06/29/2011 Document Revised: 09/21/2016 Document Reviewed: 09/21/2016 Elsevier Interactive Patient Education  2017 [employer] Levonorgestrel intrauterine device (IUD) What is this medicine? LEVONORGESTREL IUD (LEE voe nor jes trel) is a contraceptive (birth control) device. The device is placed inside the uterus by a healthcare professional. It is used to prevent pregnancy. This device can also be used to treat heavy bleeding that occurs during your period. This medicine may be used for other purposes; ask your health care provider or pharmacist if you have questions. COMMON BRAND NAME(S): Kyleena, LILETTA, Mirena, Skyla What should I tell my health care provider before I take this medicine? They need to know if you have any of these conditions: -abnormal Pap  smear -cancer of the breast, uterus, or cervix -diabetes -endometritis -genital or pelvic infection now or in the past -have more than one sexual partner or your partner has more than one partner -heart disease -history of an ectopic or tubal pregnancy -immune system problems -IUD in place -liver disease or tumor -problems with blood clots or take blood-thinners -seizures -use intravenous drugs -uterus of unusual shape -vaginal bleeding that has not been explained -an unusual or allergic reaction to levonorgestrel, other hormones, silicone, or polyethylene, medicines, foods, dyes, or preservatives -pregnant or trying to get pregnant -breast-feeding How should I use this medicine? This device is placed inside the uterus by a health care professional. Talk to your pediatrician regarding the use of this medicine in children. Special care may be needed. Overdosage: If you think you have taken too much of this medicine contact a poison control center or emergency room at once. NOTE: This medicine is only for you. Do not share this medicine with others. What if I miss a dose? This does not apply. Depending on the brand of device you have inserted, the device will need to be replaced every 3 to 5 years if you wish to continue using this type of   birth control. What may interact with this medicine? Do not take this medicine with any of the following medications: -amprenavir -bosentan -fosamprenavir This medicine may also interact with the following medications: -aprepitant -armodafinil -barbiturate medicines for inducing sleep or treating seizures -bexarotene -boceprevir -griseofulvin -medicines to treat seizures like carbamazepine, ethotoin, felbamate, oxcarbazepine, phenytoin, topiramate -modafinil -pioglitazone -rifabutin -rifampin -rifapentine -some medicines to treat HIV infection like atazanavir, efavirenz, indinavir, lopinavir, nelfinavir, tipranavir, ritonavir -[facility] John's  wort -warfarin This list may not describe all possible interactions. Give your health care provider a list of all the medicines, herbs, non-prescription drugs, or dietary supplements you use. Also tell them if you smoke, drink alcohol, or use illegal drugs. Some items may interact with your medicine. What should I watch for while using this medicine? Visit your doctor or health care professional for regular check ups. See your doctor if you or your partner has sexual contact with others, becomes HIV positive, or gets a sexual transmitted disease. This product does not protect you against HIV infection (AIDS) or other sexually transmitted diseases. You can check the placement of the IUD yourself by reaching up to the top of your vagina with clean fingers to feel the threads. Do not pull on the threads. It is a good habit to check placement after each menstrual period. Call your doctor right away if you feel more of the IUD than just the threads or if you cannot feel the threads at all. The IUD may come out by itself. You may become pregnant if the device comes out. If you notice that the IUD has come out use a backup birth control method like condoms and call your health care provider. Using tampons will not change the position of the IUD and are okay to use during your period. This IUD can be safely scanned with magnetic resonance imaging (MRI) only under specific conditions. Before you have an MRI, tell your healthcare provider that you have an IUD in place, and which type of IUD you have in place. What side effects may I notice from receiving this medicine? Side effects that you should report to your doctor or health care professional as soon as possible: -allergic reactions like skin rash, itching or hives, swelling of the face, lips, or tongue -fever, flu-like symptoms -genital sores -high blood pressure -no menstrual period for 6 weeks during use -pain, swelling, warmth in the leg -pelvic pain  or tenderness -severe or sudden headache -signs of pregnancy -stomach cramping -sudden shortness of breath -trouble with balance, talking, or walking -unusual vaginal bleeding, discharge -yellowing of the eyes or skin Side effects that usually do not require medical attention (report to your doctor or health care professional if they continue or are bothersome): -acne -breast pain -change in sex drive or performance -changes in weight -cramping, dizziness, or faintness while the device is being inserted -headache -irregular menstrual bleeding within first 3 to 6 months of use -nausea This list may not describe all possible side effects. Call your doctor for medical advice about side effects. You may report side effects to FDA at 1-800-FDA-1088. Where should I keep my medicine? This does not apply. NOTE: This sheet is a summary. It may not cover all possible information. If you have questions about this medicine, talk to your doctor, pharmacist, or health care provider.  2018 Elsevier/Gold Standard (2016-08-12 14:14:56)  

## 2017-03-15 LAB — NUSWAB VAGINITIS PLUS (VG+)
Atopobium vaginae: HIGH Score — AB
BVAB 2: HIGH Score — AB
Candida albicans, NAA: NEGATIVE
Candida glabrata, NAA: NEGATIVE
Chlamydia trachomatis, NAA: NEGATIVE
Megasphaera 1: HIGH Score — AB
Neisseria gonorrhoeae, NAA: NEGATIVE
Trich vag by NAA: NEGATIVE

## 2017-03-17 ENCOUNTER — Other Ambulatory Visit: Payer: Self-pay | Admitting: Certified Nurse Midwife

## 2017-03-17 MED ORDER — FLUCONAZOLE 150 MG PO TABS: 150.0000 mg | ORAL_TABLET | Freq: Once | ORAL | 0 refills | Status: AC

## 2017-03-17 MED ORDER — METRONIDAZOLE 500 MG PO TABS: 500.0000 mg | ORAL_TABLET | Freq: Two times a day (BID) | ORAL | 0 refills | Status: AC

## 2017-04-11 ENCOUNTER — Ambulatory Visit: Payer: PRIVATE HEALTH INSURANCE | Admitting: Certified Nurse Midwife

## 2017-11-08 ENCOUNTER — Emergency Department
Admission: EM | Admit: 2017-11-08 | Discharge: 2017-11-09 | Disposition: A | Discharge status: Home / Self Care | Payer: Self-pay | Attending: Student in an Organized Health Care Education/Training Program | Admitting: Student in an Organized Health Care Education/Training Program

## 2017-11-08 ENCOUNTER — Emergency Department: Payer: Self-pay

## 2017-11-08 ENCOUNTER — Other Ambulatory Visit: Payer: Self-pay

## 2017-11-08 DIAGNOSIS — R1033 Periumbilical pain: Secondary | ICD-10-CM | POA: Insufficient documentation

## 2017-11-08 DIAGNOSIS — R109 Unspecified abdominal pain: Secondary | ICD-10-CM

## 2017-11-08 DIAGNOSIS — R748 Abnormal levels of other serum enzymes: Secondary | ICD-10-CM | POA: Insufficient documentation

## 2017-11-08 LAB — COMPREHENSIVE METABOLIC PANEL
ALT: 13 U/L — ABNORMAL LOW (ref 14–54)
AST: 20 U/L (ref 15–41)
Albumin: 3.9 g/dL (ref 3.5–5.0)
Alkaline Phosphatase: 47 U/L (ref 38–126)
Anion gap: 6 (ref 5–15)
BUN: 9 mg/dL (ref 6–20)
CO2: 28 mmol/L (ref 22–32)
Calcium: 9 mg/dL (ref 8.9–10.3)
Chloride: 102 mmol/L (ref 101–111)
Creatinine, Ser: 1.03 mg/dL — ABNORMAL HIGH (ref 0.44–1.00)
GFR calc Af Amer: >60 mL/min (ref >60)
GFR calc non Af Amer: >60 mL/min (ref >60)
Glucose, Bld: 91 mg/dL (ref 65–99)
Potassium: 3.7 mmol/L (ref 3.5–5.1)
Sodium: 136 mmol/L (ref 135–145)
Total Bilirubin: 0.5 mg/dL (ref 0.3–1.2)
Total Protein: 7.3 g/dL (ref 6.5–8.1)

## 2017-11-08 LAB — URINALYSIS, COMPLETE (UACMP) WITH MICROSCOPIC
Bilirubin Urine: NEGATIVE
Glucose, UA: NEGATIVE mg/dL
Ketones, ur: NEGATIVE mg/dL
Leukocytes, UA: NEGATIVE
Nitrite: NEGATIVE
Protein, ur: NEGATIVE mg/dL
Specific Gravity, Urine: 1.004 — ABNORMAL LOW (ref 1.005–1.030)
Squamous Epithelial / HPF: NONE SEEN
pH: 5 (ref 5.0–8.0)

## 2017-11-08 LAB — CBC
HCT: 36.4 % (ref 35.0–47.0)
Hemoglobin: 11.8 g/dL — ABNORMAL LOW (ref 12.0–16.0)
MCH: 26.2 pg (ref 26.0–34.0)
MCHC: 32.5 g/dL (ref 32.0–36.0)
MCV: 80.7 fL (ref 80.0–100.0)
Platelets: 241 10*3/uL (ref 150–440)
RBC: 4.51 MIL/uL (ref 3.80–5.20)
RDW: 14.2 % (ref 11.5–14.5)
WBC: 5.7 10*3/uL (ref 3.6–11.0)

## 2017-11-08 LAB — LIPASE, BLOOD: Lipase: 113 U/L — ABNORMAL HIGH (ref 11–51)

## 2017-11-08 LAB — POCT PREGNANCY, URINE: Preg Test, Ur: NEGATIVE

## 2017-11-08 MED ORDER — DICYCLOMINE HCL 10 MG PO CAPS
10.0000 mg | ORAL_CAPSULE | Freq: Once | ORAL | Status: AC
  Administered 2017-11-08: 10 mg via ORAL
  Filled 2017-11-08: qty 1

## 2017-11-08 NOTE — ED Notes (Signed)
ED Provider at bedside. 

## 2017-11-08 NOTE — ED Provider Notes (Signed)
[facility] Emergency Department Provider Note    First MD Initiated Contact with Patient 11/08/17 2233     (approximate)  I have reviewed the triage vital signs and the nursing notes.   HISTORY  Chief Complaint Abdominal Pain    HPI Bethany Marshall is a 26 y.o. female presents with chief complaint of 1 year of right-sided and supra periumbilical abdominal pain is been intermittent but became acutely worse 2 days ago and is worsened by eating foods like sweet tea.  Denies any fevers.  No nausea vomiting or diarrhea.  Denies any dysuria.  Denies any chance of being pregnant.  Denies any vaginal discharge or bleeding.  Has not taken anything to relieve the pain.  No chest pain or shortness of breath.  Discussed the pain is moderate and crampy in sensation.  Past Medical History:  Diagnosis Date  . Anemia   . Anemia   . Anxiety   . Dysmenorrhea   . Elevated LFTs   . History of iron deficiency anemia   . Malaise and fatigue   . Menorrhagia   . Migraine   . Obesity   . Palpitation   . Vitamin D deficiency   . Weight loss counseling, encounter for    Family History  Problem Relation Age of Onset  . Hypertension Father   . Hypothyroidism Mother    Past Surgical History:  Procedure Laterality Date  . iron fusion     Patient Active Problem List   Diagnosis Date Noted  . Iron deficiency anemia 05/16/2016  . Dysmenorrhea 12/08/2015  . Migraine without aura and responsive to treatment 12/08/2015  . Awareness of heartbeats 12/08/2015  . Vitamin D deficiency 12/08/2015  . Excessive and frequent menstruation 12/08/2015  . Palpitations 07/08/2014  . Paroxysmal supraventricular tachycardia (HCC) 07/08/2014      Prior to Admission medications   Medication Sig Start Date End Date Taking? Authorizing Provider  cholecalciferol (VITAMIN D) 1000 units tablet Take 1,000 Units by mouth daily. Reported on 02/05/2016    [provider]  ferrous sulfate  325 (65 FE) MG tablet Take by mouth. Reported on 02/05/2016    [provider]  misoprostol (CYTOTEC) 200 MCG tablet Place two tablets in the vagina the night prior to your next clinic appointment 03/10/17   Jeralyn BennettLawhorn, Vanessa DurhamJenkins Michelle, CNM  naproxen (NAPROSYN) 500 MG tablet Take 1 tablet (500 mg total) by mouth 2 (two) times daily with a meal. 11/09/17 11/09/18  Willy Eddyobinson, Jadyn Brasher, MD  omeprazole (PRILOSEC) 40 MG capsule Take 1 capsule (40 mg total) by mouth daily. Patient not taking: Reported on 03/10/2017 01/11/17   Alba CorySowles, Krichna, MD  promethazine (PHENERGAN) 12.5 MG tablet Take 1 tablet (12.5 mg total) by mouth every 6 (six) hours as needed for nausea or vomiting. 11/09/17   Willy Eddyobinson, Zunairah Devers, MD  ranitidine (ZANTAC) 150 MG tablet Take 1 tablet (150 mg total) by mouth 2 (two) times daily. 11/09/17 11/09/18  Willy Eddyobinson, Laquida Cotrell, MD  Vitamin D, Ergocalciferol, (DRISDOL) 50000 units CAPS capsule Take 1 capsule (50,000 Units total) by mouth every 7 (seven) days. Patient not taking: Reported on 03/10/2017 01/11/17   Alba CorySowles, Krichna, MD    Allergies Patient has no known allergies.    Social History Social History   Tobacco Use  . Smoking status: Never Smoker  . Smokeless tobacco: Never Used  Substance Use Topics  . Alcohol use: Yes    Alcohol/week: 0.0 oz    Comment: Rare   . Drug use:  No    Review of Systems Patient denies headaches, rhinorrhea, blurry vision, numbness, shortness of breath, chest pain, edema, cough, abdominal pain, nausea, vomiting, diarrhea, dysuria, fevers, rashes or hallucinations unless otherwise stated above in HPI. ____________________________________________   PHYSICAL EXAM:  VITAL SIGNS: Vitals:   11/08/17 2229 11/09/17 0000  BP: 122/80 109/75  Pulse: 70 71  Resp: 20   Temp: 97.7 F (36.5 C)   SpO2: 100% 100%    Constitutional: Alert and oriented. Well appearing and in no acute distress. Eyes: Conjunctivae are normal.  Head: Atraumatic. Nose:  No congestion/rhinnorhea. Mouth/Throat: Mucous membranes are moist.   Neck: No stridor. Painless ROM.  Cardiovascular: Normal rate, regular rhythm. Grossly normal heart sounds.  Good peripheral circulation. Respiratory: Normal respiratory effort.  No retractions. Lungs CTAB. Gastrointestinal: Soft and nontender. No distention. No abdominal bruits. No CVA tenderness. Genitourinary:deferred Musculoskeletal: No lower extremity tenderness nor edema.  No joint effusions. Neurologic:  Normal speech and language. No gross focal neurologic deficits are appreciated. No facial droop Skin:  Skin is warm, dry and intact. No rash noted. Psychiatric: Mood and affect are normal. Speech and behavior are normal.  ____________________________________________   LABS (all labs ordered are listed, but only abnormal results are displayed)  Results for orders placed or performed during the hospital encounter of 11/08/17 (from the past 24 hour(s))  Urinalysis, Complete w Microscopic     Status: Abnormal   Collection Time: 11/08/17 10:38 PM  Result Value Ref Range   Color, Urine STRAW (A) YELLOW   APPearance CLEAR (A) CLEAR   Specific Gravity, Urine 1.004 (L) 1.005 - 1.030   pH 5.0 5.0 - 8.0   Glucose, UA NEGATIVE NEGATIVE mg/dL   Hgb urine dipstick SMALL (A) NEGATIVE   Bilirubin Urine NEGATIVE NEGATIVE   Ketones, ur NEGATIVE NEGATIVE mg/dL   Protein, ur NEGATIVE NEGATIVE mg/dL   Nitrite NEGATIVE NEGATIVE   Leukocytes, UA NEGATIVE NEGATIVE   RBC / HPF 0-5 0 - 5 RBC/hpf   WBC, UA 0-5 0 - 5 WBC/hpf   Bacteria, UA RARE (A) NONE SEEN   Squamous Epithelial / LPF NONE SEEN NONE SEEN  CBC     Status: Abnormal   Collection Time: 11/08/17 10:47 PM  Result Value Ref Range   WBC 5.7 3.6 - 11.0 K/uL   RBC 4.51 3.80 - 5.20 MIL/uL   Hemoglobin 11.8 (L) 12.0 - 16.0 g/dL   HCT 09.836.4 11.935.0 - 14.747.0 %   MCV 80.7 80.0 - 100.0 fL   MCH 26.2 26.0 - 34.0 pg   MCHC 32.5 32.0 - 36.0 g/dL   RDW 82.914.2 56.211.5 - 13.014.5 %    Platelets 241 150 - 440 K/uL  Comprehensive metabolic panel     Status: Abnormal   Collection Time: 11/08/17 11:20 PM  Result Value Ref Range   Sodium 136 135 - 145 mmol/L   Potassium 3.7 3.5 - 5.1 mmol/L   Chloride 102 101 - 111 mmol/L   CO2 28 22 - 32 mmol/L   Glucose, Bld 91 65 - 99 mg/dL   BUN 9 6 - 20 mg/dL   Creatinine, Ser 8.651.03 (H) 0.44 - 1.00 mg/dL   Calcium 9.0 8.9 - 78.410.3 mg/dL   Total Protein 7.3 6.5 - 8.1 g/dL   Albumin 3.9 3.5 - 5.0 g/dL   AST 20 15 - 41 U/L   ALT 13 (L) 14 - 54 U/L   Alkaline Phosphatase 47 38 - 126 U/L   Total Bilirubin 0.5 0.3 -  1.2 mg/dL   GFR calc non Af Amer >60 >60 mL/min   GFR calc Af Amer >60 >60 mL/min   Anion gap 6 5 - 15  Lipase, blood     Status: Abnormal   Collection Time: 11/08/17 11:20 PM  Result Value Ref Range   Lipase 113 (H) 11 - 51 U/L  Pregnancy, urine POC     Status: None   Collection Time: 11/08/17 11:33 PM  Result Value Ref Range   Preg Test, Ur NEGATIVE NEGATIVE   ____________________________________________ ____________________________________________  RADIOLOGY  I personally reviewed all radiographic images ordered to evaluate for the above acute complaints and reviewed radiology reports and findings.  These findings were personally discussed with the patient.  Please see medical record for radiology report.  ____________________________________________   PROCEDURES  Procedure(s) performed:  Procedures    Critical Care performed: no ____________________________________________   INITIAL IMPRESSION / ASSESSMENT AND PLAN / ED COURSE  Pertinent labs & imaging results that were available during my care of the patient were reviewed by me and considered in my medical decision making (see chart for details).  DDX: Urinary tract infection, enteritis, gastritis, appendicitis, colitis, small bowel obstruction, cholecystitis, cholelithiasis  Bethany Marshall is a 26 y.o. who presents to the ED with abdominal pain as  described above.  Patient is afebrile and hemodynamically stable and does not appear to be in distress.  Her abdominal exam is soft and benign.  Given her worsening symptoms will some blood work and order a plain film radiograph to evaluate for the above differential.  A low suspicion for appendicitis based on the duration of her symptoms and no right lower quadrant tenderness to palpation.  Blood work does show evidence of elevated lipase but no evidence of other associated biliary obstruction or hepatitis.  Based on her worsening and pain will order CT imaging to exclude pseudocyst or stone.  Repeat abdominal exam is benign at this time.  Patient will be signed out to Dr. Juliette Alcide pending CT imaging.  Do anticipate discharge home with GI f/u.  Have discussed with the patient and available family all diagnostics and treatments performed thus far and all questions were answered to the best of my ability. The patient demonstrates understanding and agreement with plan.       ____________________________________________   FINAL CLINICAL IMPRESSION(S) / ED DIAGNOSES  Final diagnoses:  Abdominal pain  Elevated lipase      NEW MEDICATIONS STARTED DURING THIS VISIT:  This SmartLink is deprecated. Use AVSMEDLIST instead to display the medication list for a patient.   Note:  This document was prepared using Dragon voice recognition software and may include unintentional dictation errors.    Willy Eddy, MD 11/09/17 [phone]

## 2017-11-08 NOTE — ED Triage Notes (Signed)
Pt in with co abd pain states to right mid abd for a year. States for 2 days pain has worsened, denies any n.v.d or dysuria.

## 2017-11-08 NOTE — ED Notes (Signed)
Pt states she has had abd pain for the last year. Pt states it has been worse the last 2 days. Pt states pain is in the right mid area. Pt is A/O x4 NAD.

## 2017-11-09 ENCOUNTER — Emergency Department: Payer: Self-pay

## 2017-11-09 MED ORDER — RANITIDINE HCL 150 MG PO TABS: 150.0000 mg | ORAL_TABLET | Freq: Two times a day (BID) | ORAL | 1 refills | Status: DC

## 2017-11-09 MED ORDER — PROMETHAZINE HCL 12.5 MG PO TABS: 12.5000 mg | ORAL_TABLET | Freq: Four times a day (QID) | ORAL | 0 refills | Status: DC | PRN

## 2017-11-09 MED ORDER — SODIUM CHLORIDE 0.9 % IV BOLUS (SEPSIS)
1000.0000 mL | Freq: Once | INTRAVENOUS | Status: AC
  Administered 2017-11-09: 1000 mL via INTRAVENOUS

## 2017-11-09 MED ORDER — IOPAMIDOL (ISOVUE-370) INJECTION 76%
100.0000 mL | Freq: Once | INTRAVENOUS | Status: AC | PRN
  Administered 2017-11-09: 100 mL via INTRAVENOUS

## 2017-11-09 MED ORDER — NAPROXEN 500 MG PO TABS: 500.0000 mg | ORAL_TABLET | Freq: Two times a day (BID) | ORAL | 0 refills | Status: AC

## 2017-11-09 MED ORDER — PROMETHAZINE HCL 25 MG/ML IJ SOLN
12.5000 mg | Freq: Four times a day (QID) | INTRAMUSCULAR | Status: DC | PRN
  Administered 2017-11-09: 12.5 mg via INTRAVENOUS
  Filled 2017-11-09: qty 1

## 2017-11-09 NOTE — ED Notes (Signed)
Patient transported to CT 

## 2017-11-09 NOTE — Discharge Instructions (Addendum)
Please return if bad pain returns pain, fever or vomiting.  Follow-up with your doctor in the next week or so unless you are well

## 2018-12-17 ENCOUNTER — Emergency Department
Admission: EM | Admit: 2018-12-17 | Discharge: 2018-12-17 | Disposition: A | Discharge status: Home / Self Care | Payer: Self-pay | Attending: Emergency Medicine | Admitting: Emergency Medicine

## 2018-12-17 ENCOUNTER — Encounter: Payer: Self-pay | Admitting: Emergency Medicine

## 2018-12-17 ENCOUNTER — Emergency Department: Payer: Self-pay

## 2018-12-17 ENCOUNTER — Other Ambulatory Visit: Payer: Self-pay

## 2018-12-17 DIAGNOSIS — Y93B9 Activity, other involving muscle strengthening exercises: Secondary | ICD-10-CM | POA: Insufficient documentation

## 2018-12-17 DIAGNOSIS — Y92838 Other recreation area as the place of occurrence of the external cause: Secondary | ICD-10-CM | POA: Insufficient documentation

## 2018-12-17 DIAGNOSIS — Y998 Other external cause status: Secondary | ICD-10-CM | POA: Insufficient documentation

## 2018-12-17 DIAGNOSIS — X509XXA Other and unspecified overexertion or strenuous movements or postures, initial encounter: Secondary | ICD-10-CM | POA: Insufficient documentation

## 2018-12-17 DIAGNOSIS — S8992XA Unspecified injury of left lower leg, initial encounter: Secondary | ICD-10-CM | POA: Insufficient documentation

## 2018-12-17 NOTE — Discharge Instructions (Addendum)
Follow-up with Dr. Rosita Kea for evaluation of your left knee.  Wear the knee immobilizer for 1 to 2 weeks.  Take Tylenol and ibuprofen for pain and swelling.  Apply ice to the knee for at least 30 minutes after being up on the knee.  Return to emergency department if worsening

## 2018-12-17 NOTE — ED Provider Notes (Signed)
[facility] Emergency Department Provider Note  ____________________________________________   First MD Initiated Contact with Patient 12/17/18 1652     (approximate)  I have reviewed the triage vital signs and the nursing notes.   HISTORY  Chief Complaint Knee Injury    HPI Bethany Marshall is a 28 y.o. female presents to the emergency department complaining of left knee pain.  She states she was doing squats last week and felt a pop in the knee.  States it feels unstable since then.  When she goes to bend the knee or go up steps it starts to swell.  She denies any numbness or tingling.    Past Medical History:  Diagnosis Date  . Anemia   . Anemia   . Anxiety   . Dysmenorrhea   . Elevated LFTs   . History of iron deficiency anemia   . Malaise and fatigue   . Menorrhagia   . Migraine   . Obesity   . Palpitation   . Vitamin D deficiency   . Weight loss counseling, encounter for     Patient Active Problem List   Diagnosis Date Noted  . Iron deficiency anemia 05/16/2016  . Dysmenorrhea 12/08/2015  . Migraine without aura and responsive to treatment 12/08/2015  . Awareness of heartbeats 12/08/2015  . Vitamin D deficiency 12/08/2015  . Excessive and frequent menstruation 12/08/2015  . Palpitations 07/08/2014  . Paroxysmal supraventricular tachycardia (HCC) 07/08/2014    Past Surgical History:  Procedure Laterality Date  . iron fusion      Prior to Admission medications   Medication Sig Start Date End Date Taking? Authorizing Provider  cholecalciferol (VITAMIN D) 1000 units tablet Take 1,000 Units by mouth daily. Reported on 02/05/2016    [provider]  misoprostol (CYTOTEC) 200 MCG tablet Place two tablets in the vagina the night prior to your next clinic appointment 03/10/17   Jeralyn Bennett, [place]essa [place], CNM  promethazine (PHENERGAN) 12.5 MG tablet Take 1 tablet (12.5 mg total) by mouth every 6 (six) hours as needed for nausea or  vomiting. 11/09/17   Willy Eddy, MD  ranitidine (ZANTAC) 150 MG tablet Take 1 tablet (150 mg total) by mouth 2 (two) times daily. 11/09/17 11/09/18  Willy Eddy, MD    Allergies Patient has no known allergies.  Family History  Problem Relation Age of Onset  . Hypertension Father   . Hypothyroidism Mother     Social History Social History   Tobacco Use  . Smoking status: Never Smoker  . Smokeless tobacco: Never Used  Substance Use Topics  . Alcohol use: Yes    Alcohol/week: 0.0 standard drinks    Comment: Rare   . Drug use: No    Review of Systems  Constitutional: No fever/chills Eyes: No visual changes. ENT: No sore throat. Respiratory: Denies cough Genitourinary: Negative for dysuria. Musculoskeletal: Negative for back pain.  Positive for left knee pain Skin: Negative for rash.    ____________________________________________   PHYSICAL EXAM:  VITAL SIGNS: ED Triage Vitals  Enc Vitals Group     BP 12/17/18 1627 118/67     Pulse Rate 12/17/18 1627 85     Resp 12/17/18 1627 20     Temp 12/17/18 1627 98.2 F (36.8 C)     Temp Source 12/17/18 1627 Oral     SpO2 12/17/18 1627 99 %     Weight 12/17/18 1625 164 lb (74.4 kg)     Height 12/17/18 1625 5\' 5"  (1.651 m)  Head Circumference --      Peak Flow --      Pain Score 12/17/18 1625 6     Pain Loc --      Pain Edu? --      Excl. in GC? --     Constitutional: Alert and oriented. Well appearing and in no acute distress. Eyes: Conjunctivae are normal.  Head: Atraumatic. Nose: No congestion/rhinnorhea. Mouth/Throat: Mucous membranes are moist.   Neck:  supple no lymphadenopathy noted Cardiovascular: Normal rate, regular rhythm Respiratory: Normal respiratory effort.  No retractions,  GU: deferred Musculoskeletal: FROM all extremities, warm and well perfused, joint line is tender with some swelling noted, ligament is lax with the anterior drawer sign, neurovascular is intact Neurologic:   Normal speech and language.  Skin:  Skin is warm, dry and intact. No rash noted. Psychiatric: Mood and affect are normal. Speech and behavior are normal.  ____________________________________________   LABS (all labs ordered are listed, but only abnormal results are displayed)  Labs Reviewed - No data to display ____________________________________________   ____________________________________________  RADIOLOGY  X-ray of the left knee is negative for fracture or effusion  ____________________________________________   PROCEDURES  Procedure(s) performed: Knee immobilizer was applied by the tech  Procedures    ____________________________________________   INITIAL IMPRESSION / ASSESSMENT AND PLAN / ED COURSE  Pertinent labs & imaging results that were available during my care of the patient were reviewed by me and considered in my medical decision making (see chart for details).   Patient is a 28 year old female presents emergency department after a left knee injury while doing squats.  Physical exam shows that the left knee is tender with laxity noted with anterior drawer sign  X-ray of the left knee is negative for fracture or effusion  Explained the x-ray findings to the patient.  She is placed in a knee immobilizer.  She is to take Tylenol or ibuprofen as needed for pain.  Return emergency department worsening.  Follow-up with orthopedics in 1 to 2 weeks.  States she understands will comply.  She is discharged stable condition.     As part of my medical decision making, I reviewed the following data within the electronic MEDICAL RECORD NUMBER Nursing notes reviewed and incorporated, Old chart reviewed, Radiograph reviewed x-ray of the left knee is negative, Notes from prior ED visits and [place] Controlled Substance Database  ____________________________________________   FINAL CLINICAL IMPRESSION(S) / ED DIAGNOSES  Final diagnoses:  Injury of left knee, initial  encounter      NEW MEDICATIONS STARTED DURING THIS VISIT:  Discharge Medication List as of 12/17/2018  6:01 PM       Note:  This document was prepared using Dragon voice recognition software and may include unintentional dictation errors.    Faythe Ghee, PA-C 12/17/18 2015    Jeanmarie Plant, MD 12/17/18 [phone]

## 2018-12-17 NOTE — ED Triage Notes (Signed)
States injured left knee last Thursday while doing squats with weights.  Patient states knee, popped.

## 2018-12-17 NOTE — ED Triage Notes (Signed)
States was doing squats last Monday, heard a pop, "goes out" on her intermittently since then and is painful.

## 2019-05-23 ENCOUNTER — Telehealth: Payer: Self-pay

## 2019-05-23 NOTE — Telephone Encounter (Signed)
Coronavirus (COVID-19) Are you at risk?  Are you at risk for the Coronavirus (COVID-19)?  To be considered HIGH RISK for Coronavirus (COVID-19), you have to meet the following criteria:  . Traveled to [place], [place], [place], [place] or [place]; or in the [place] to [place], [place], [place], or [place]; and have fever, cough, and shortness of breath within the last 2 weeks of travel OR . Been in close contact with a person diagnosed with COVID-19 within the last 2 weeks and have fever, cough, and shortness of breath . IF YOU DO NOT MEET THESE CRITERIA, YOU ARE CONSIDERED LOW RISK FOR COVID-19.  What to do if you are HIGH RISK for COVID-19?  . If you are having a medical emergency, call 911. . Seek medical care right away. Before you go to a doctor's office, urgent care or emergency department, call ahead and tell them about your recent travel, contact with someone diagnosed with COVID-19, and your symptoms. You should receive instructions from your physician's office regarding next steps of care.  . When you arrive at healthcare provider, tell the healthcare staff immediately you have returned from visiting [place], [place], [place], [place] or [place]; or traveled in the [place] to [place], [place], [place], or [place]; in the last two weeks or you have been in close contact with a person diagnosed with COVID-19 in the last 2 weeks.   . Tell the health care staff about your symptoms: fever, cough and shortness of breath. . After you have been seen by a medical provider, you will be either: o Tested for (COVID-19) and discharged home on quarantine except to seek medical care if symptoms worsen, and asked to  - Stay home and avoid contact with others until you get your results (4-5 days)  - Avoid travel on public transportation if possible (such as bus, train, or airplane) or o Sent to the Emergency Department by EMS for evaluation, COVID-19 testing, and possible  admission depending on your condition and test results.  What to do if you are LOW RISK for COVID-19?  Reduce your risk of any infection by using the same precautions used for avoiding the common cold or flu:  . Wash your hands often with soap and warm water for at least 20 seconds.  If soap and water are not readily available, use an alcohol-based hand sanitizer with at least 60% alcohol.  . If coughing or sneezing, cover your mouth and nose by coughing or sneezing into the elbow areas of your shirt or coat, into a tissue or into your sleeve (not your hands). . Avoid shaking hands with others and consider head nods or verbal greetings only. . Avoid touching your eyes, nose, or mouth with unwashed hands.  . Avoid close contact with people who are Bethany Marshall. . Avoid places or events with large numbers of people in one location, like concerts or sporting events. . Carefully consider travel plans you have or are making. . If you are planning any travel outside or inside the [place], visit the CDC's Travelers' Health webpage for the latest health notices. . If you have some symptoms but not all symptoms, continue to monitor at home and seek medical attention if your symptoms worsen. . If you are having a medical emergency, call 911.  05/23/19 SCREENING NEG SLS ADDITIONAL HEALTHCARE OPTIONS FOR PATIENTS  [facility] / e-Visit: [email or web address]         [facility]: [phone]    [facility]: [phone]                   [facility]: [phone]  

## 2019-05-24 ENCOUNTER — Ambulatory Visit: Payer: Self-pay | Admitting: Certified Nurse Midwife

## 2019-05-24 ENCOUNTER — Other Ambulatory Visit: Payer: Self-pay

## 2019-05-24 ENCOUNTER — Encounter: Payer: Self-pay | Admitting: Certified Nurse Midwife

## 2019-05-24 VITALS — BP 112/67 | HR 47 | Ht 65.0 in | Wt 167.4 lb

## 2019-05-24 DIAGNOSIS — N920 Excessive and frequent menstruation with regular cycle: Secondary | ICD-10-CM

## 2019-05-24 DIAGNOSIS — R42 Dizziness and giddiness: Secondary | ICD-10-CM

## 2019-05-24 DIAGNOSIS — Z862 Personal history of diseases of the blood and blood-forming organs and certain disorders involving the immune mechanism: Secondary | ICD-10-CM

## 2019-05-24 DIAGNOSIS — N946 Dysmenorrhea, unspecified: Secondary | ICD-10-CM

## 2019-05-24 NOTE — Patient Instructions (Signed)
Dysmenorrhea Dysmenorrhea means painful cramps during your period (menstrual period). You will have pain in your lower belly (abdomen). The pain is caused by the tightening (contracting) of the muscles of the womb (uterus). The pain may be mild or very bad. With this condition, you may:  Have a headache.  Feel sick to your stomach (nauseous).  Throw up (vomit).  Have lower back pain. Follow these instructions at home: Helping pain and cramping   Put heat on your lower back or belly when you have pain or cramps. Use the heat source that your doctor tells you to use. ? Place a towel between your skin and the heat. ? Leave the heat on for 20-30 minutes. ? Remove the heat if your skin turns bright red. This is especially important if you cannot feel pain, heat, or cold. ? Do not have a heating pad on during sleep.  Do aerobic exercises. These include walking, swimming, or biking. These may help with cramps.  Massage your lower back or belly. This may help lessen pain. General instructions  Take over-the-counter and prescription medicines only as told by your doctor.  Do not drive or use heavy machinery while taking prescription pain medicine.  Avoid alcohol and caffeine during and right before your period. These can make cramps worse.  Do not use any products that have nicotine or tobacco. These include cigarettes and e-cigarettes. If you need help quitting, ask your doctor.  Keep all follow-up visits as told by your doctor. This is important. Contact a doctor if:  You have pain that gets worse.  You have pain that does not get better with medicine.  You have pain during sex.  You feel sick to your stomach or you throw up during your period, and medicine does not help. Get help right away if:  You pass out (faint). Summary  Dysmenorrhea means painful cramps during your period (menstrual period).  Put heat on your lower back or belly when you have pain or cramps.  Do  exercises like walking, swimming, or biking to help with cramps.  Contact a doctor if you have pain during sex. This information is not intended to replace advice given to you by your health care provider. Make sure you discuss any questions you have with your health care provider. Document Released: 01/27/2009 Document Revised: 10/13/2017 Document Reviewed: 11/17/2016 Elsevier Patient Education  2020 [employer] Menorrhagia  Menorrhagia is a condition in which menstrual periods are heavy or last longer than normal. With menorrhagia, most periods a woman has may cause enough blood loss and cramping that she becomes unable to take part in her usual activities. What are the causes? Common causes of this condition include:  Noncancerous growths in the uterus (polyps or fibroids).  An imbalance of the estrogen and progesterone hormones.  One of the ovaries not releasing an egg during one or more months.  A problem with the thyroid gland (hypothyroid).  Side effects of having an intrauterine device (IUD).  Side effects of some medicines, such as anti-inflammatory medicines or blood thinners.  A bleeding disorder that stops the blood from clotting normally. In some cases, the cause of this condition is not known. What are the signs or symptoms? Symptoms of this condition include:  Routinely having to change your pad or tampon every 1-2 hours because it is completely soaked.  Needing to use pads and tampons at the same time because of heavy bleeding.  Needing to wake up to change your pads or  tampons during the night.  Passing blood clots larger than 1 inch (2.5 cm) in size.  Having bleeding that lasts for more than 7 days.  Having symptoms of low iron levels (anemia), such as tiredness, fatigue, or shortness of breath. How is this diagnosed? This condition may be diagnosed based on:  A physical exam.  Your symptoms and menstrual history.  Tests, such as: ? Blood tests to  check if you are pregnant or have hormonal changes, a bleeding or thyroid disorder, anemia, or other problems. ? Pap test to check for cancerous changes, infections, or inflammation. ? Endometrial biopsy. This test involves removing a tissue sample from the lining of the uterus (endometrium) to be examined under a microscope. ? Pelvic ultrasound. This test uses sound waves to create images of your uterus, ovaries, and vagina. The images can show if you have fibroids or other growths. ? Hysteroscopy. For this test, a small telescope is used to look inside your uterus. How is this treated? Treatment may not be needed for this condition. If it is needed, the best treatment for you will depend on:  Whether you need to prevent pregnancy.  Your desire to have children in the future.  The cause and severity of your bleeding.  Your personal preference. Medicines are the first step in treatment. You may be treated with:  Hormonal birth control methods. These treatments reduce bleeding during your menstrual period. They include: ? Birth control pills. ? Skin patch. ? Vaginal ring. ? Shots (injections) that you get every 3 months. ? Hormonal IUD (intrauterine device). ? Implants that go under the skin.  Medicines that thicken blood and slow bleeding.  Medicines that reduce swelling, such as ibuprofen.  Medicines that contain an artificial (synthetic) hormone called progestin.  Medicines that make the ovaries stop working for a short time.  Iron supplements to treat anemia. If medicines do not work, surgery may be done. Surgical options may include:  Dilation and curettage (D&C). In this procedure, your health care provider opens (dilates) your cervix and then scrapes or suctions tissue from the endometrium to reduce menstrual bleeding.  Operative hysteroscopy. In this procedure, a small tube with a light on the end (hysteroscope) is used to view your uterus and help remove polyps that may  be causing heavy periods.  Endometrial ablation. This is when various techniques are used to permanently destroy your entire endometrium. After endometrial ablation, most women have little or no menstrual flow. This procedure reduces your ability to become pregnant.  Endometrial resection. In this procedure, an electrosurgical wire loop is used to remove the endometrium. This procedure reduces your ability to become pregnant.  Hysterectomy. This is surgical removal of the uterus. This is a permanent procedure that stops menstrual periods. Pregnancy is not possible after a hysterectomy. Follow these instructions at home: Medicines  Take over-the-counter and prescription medicines exactly as told by your health care provider. This includes iron pills.  Do not change or switch medicines without asking your health care provider.  Do not take aspirin or medicines that contain aspirin 1 week before or during your menstrual period. Aspirin may make bleeding worse. General instructions  If you need to change your sanitary pad or tampon more than once every 2 hours, limit your activity until the bleeding stops.  Iron pills can cause constipation. To prevent or treat constipation while you are taking prescription iron supplements, your health care provider may recommend that you: ? Drink enough fluid to keep your urine  clear or pale yellow. ? Take over-the-counter or prescription medicines. ? Eat foods that are high in fiber, such as fresh fruits and vegetables, whole grains, and beans. ? Limit foods that are high in fat and processed sugars, such as fried and sweet foods.  Eat well-balanced meals, including foods that are high in iron. Foods that have a lot of iron include leafy green vegetables, meat, liver, eggs, and whole grain breads and cereals.  Do not try to lose weight until the abnormal bleeding has stopped and your blood iron level is back to normal. If you need to lose weight, work with  your health care provider to lose weight safely.  Keep all follow-up visits as told by your health care provider. This is important. Contact a health care provider if:  You soak through a pad or tampon every 1 or 2 hours, and this happens every time you have a period.  You need to use pads and tampons at the same time because you are bleeding so much.  You have nausea, vomiting, diarrhea, or other problems related to medicines you are taking. Get help right away if:  You soak through more than a pad or tampon in 1 hour.  You pass clots bigger than 1 inch (2.5 cm) wide.  You feel short of breath.  You feel like your heart is beating too fast.  You feel dizzy or faint.  You feel very weak or tired. Summary  Menorrhagia is a condition in which menstrual periods are heavy or last longer than normal.  Treatment will depend on the cause of the condition and may include medicines or procedures.  Take over-the-counter and prescription medicines exactly as told by your health care provider. This includes iron pills.  Get help right away if you have heavy bleeding that soaks through more than a pad or tampon in 1 hour, you are passing large clots, or you feel dizzy, faint or short of breath. This information is not intended to replace advice given to you by your health care provider. Make sure you discuss any questions you have with your health care provider. Document Released: 10/31/2005 Document Revised: 02/07/2018 Document Reviewed: 10/24/2016 Elsevier Patient Education  2020 [employer].

## 2019-05-24 NOTE — Progress Notes (Signed)
GYN ENCOUNTER NOTE  Subjective:       Bethany Marshall is a 28 y.o. G0P0000 female is here for gynecologic evaluation of the following issues:  1. Heavy, painful menses  Patient previously seen for same concerns on 03/10/2017 without follow up. Notes heavy, painful menstrual bleeding that requires her to change a super plus tampon every two (2) hours.   History significant for anemia requiring iron supplementation and iron infusion. Not currently taking any supplements.   Denies difficulty breathing or respiratory distress, dysuria, and leg pain or swelling.    Gynecologic History  Patient's last menstrual period was 05/19/2019 (exact date).  Contraception: none, same sex relationship  Last Pap: 2015. Results were: normal  Obstetric History  OB History  Gravida Para Term Preterm AB Living  0 0 0 0 0 0  SAB TAB Ectopic Multiple Live Births  0 0 0 0 0    Past Medical History:  Diagnosis Date  . Anemia   . Anemia   . Anxiety   . Dysmenorrhea   . Elevated LFTs   . History of iron deficiency anemia   . Malaise and fatigue   . Menorrhagia   . Migraine   . Obesity   . Palpitation   . Vitamin D deficiency   . Weight loss counseling, encounter for     Past Surgical History:  Procedure Laterality Date  . iron fusion      Current Outpatient Medications on File Prior to Visit  Medication Sig Dispense Refill  . ranitidine (ZANTAC) 150 MG tablet Take 1 tablet (150 mg total) by mouth 2 (two) times daily. 60 tablet 1   No current facility-administered medications on file prior to visit.     No Known Allergies  Social History   Socioeconomic History  . Marital status: Single    Spouse name: Not on file  . Number of children: Not on file  . Years of education: Not on file  . Highest education level: Not on file  Occupational History  . Not on file  Social Needs  . Financial resource strain: Not on file  . Food insecurity    Worry: Not on file    Inability: Not on  file  . Transportation needs    Medical: Not on file    Non-medical: Not on file  Tobacco Use  . Smoking status: Never Smoker  . Smokeless tobacco: Never Used  Substance and Sexual Activity  . Alcohol use: Yes    Alcohol/week: 0.0 standard drinks    Comment: Rare   . Drug use: No  . Sexual activity: Yes    Birth control/protection: None    Comment: homosexual   Lifestyle  . Physical activity    Days per week: Not on file    Minutes per session: Not on file  . Stress: Not on file  Relationships  . Social Herbalist on phone: Not on file    Gets together: Not on file    Attends religious service: Not on file    Active member of club or organization: Not on file    Attends meetings of clubs or organizations: Not on file    Relationship status: Not on file  . Intimate partner violence    Fear of current or ex partner: Not on file    Emotionally abused: Not on file    Physically abused: Not on file    Forced sexual activity: Not on file  Other Topics Concern  .  Not on file  Social History Narrative  . Not on file    Family History  Problem Relation Age of Onset  . Hypertension Father   . Hypothyroidism Mother     The following portions of the patient's history were reviewed and updated as appropriate: allergies, current medications, past family history, past medical history, past social history, past surgical history and problem list.  Review of Systems  ROS negative except as noted above. Information obtained from patient.   Objective:   BP 112/67   Pulse (!) 47   Ht 5\' 5"  (1.651 m)   Wt 167 lb 6 oz (75.9 kg)   LMP 05/19/2019 (Exact Date)   BMI 27.85 kg/m    CONSTITUTIONAL: Well-developed, well-nourished female in no acute distress.   PHYSICAL EXAM: Not indicated.   Recent Results (from the past 2160 hour(s))  CBC     Status: Abnormal   Collection Time: 05/24/19  4:27 PM  Result Value Ref Range   WBC 4.4 3.4 - 10.8 x10E3/uL   RBC 4.24 3.77 -  5.28 x10E6/uL   Hemoglobin 10.7 (L) 11.1 - 15.9 g/dL   Hematocrit 16.133.7 (L) 09.634.0 - 46.6 %   MCV 80 79 - 97 fL   MCH 25.2 (L) 26.6 - 33.0 pg   MCHC 31.8 31.5 - 35.7 g/dL   RDW 04.514.0 40.911.7 - 81.115.4 %   Platelets 287 150 - 450 x10E3/uL  Ferritin     Status: Abnormal   Collection Time: 05/24/19  4:27 PM  Result Value Ref Range   Ferritin 10 (L) 15 - 150 ng/mL    Assessment:   1. Menorrhagia with regular cycle  - CBC - Ferritin - US PELVIS (TRANSABDOMINAL ONLY); Future - US PELVIS TRANSVAGINAL NON-OB (TV ONLY); Future  2. Dysmenorrhea - US PELVIS (TRANSABDOMINAL ONLY); Future - US PELVIS TRANSVAGINAL NON-OB (TV ONLY); Future  3. History of anemia  - CBC - Ferritin  4. Dizziness  - CBC - Ferritin     Plan:   Labs collected today, see orders.   Discussed management options including NSAIDs, OCPs, Tranexamic acid, and hormonal IUDs. Risk and benefits of each reviewed.   Encouraged OTC oral liquid iron supplementation.   Reviewed red flag symptoms and when to call.   RTC x 1-2 weeks for ultrasound and results review.    Gunnar BullaJenkins Michelle Hilton Saephan, CNM [facility], [facility]

## 2019-05-25 LAB — CBC
Hematocrit: 33.7 % — ABNORMAL LOW (ref 34.0–46.6)
Hemoglobin: 10.7 g/dL — ABNORMAL LOW (ref 11.1–15.9)
MCH: 25.2 pg — ABNORMAL LOW (ref 26.6–33.0)
MCHC: 31.8 g/dL (ref 31.5–35.7)
MCV: 80 fL (ref 79–97)
Platelets: 287 10*3/uL (ref 150–450)
RBC: 4.24 x10E6/uL (ref 3.77–5.28)
RDW: 14 % (ref 11.7–15.4)
WBC: 4.4 10*3/uL (ref 3.4–10.8)

## 2019-05-25 LAB — FERRITIN: Ferritin: 10 ng/mL — ABNORMAL LOW (ref 15–150)

## 2019-05-30 ENCOUNTER — Other Ambulatory Visit: Payer: Self-pay

## 2019-05-30 ENCOUNTER — Encounter: Payer: Self-pay | Admitting: Certified Nurse Midwife

## 2019-06-20 ENCOUNTER — Other Ambulatory Visit: Payer: Self-pay

## 2019-06-20 ENCOUNTER — Encounter: Payer: Self-pay | Admitting: Certified Nurse Midwife

## 2019-07-28 ENCOUNTER — Emergency Department
Admission: EM | Admit: 2019-07-28 | Discharge: 2019-07-28 | Disposition: A | Discharge status: Home / Self Care | Payer: Self-pay | Attending: Emergency Medicine | Admitting: Emergency Medicine

## 2019-07-28 ENCOUNTER — Other Ambulatory Visit: Payer: Self-pay

## 2019-07-28 ENCOUNTER — Encounter: Payer: Self-pay | Admitting: Emergency Medicine

## 2019-07-28 DIAGNOSIS — T148XXA Other injury of unspecified body region, initial encounter: Secondary | ICD-10-CM

## 2019-07-28 DIAGNOSIS — J029 Acute pharyngitis, unspecified: Secondary | ICD-10-CM | POA: Insufficient documentation

## 2019-07-28 LAB — GROUP A STREP BY PCR: Group A Strep by PCR: NOT DETECTED

## 2019-07-28 NOTE — Discharge Instructions (Addendum)
Follow-up with your regular doctor if not better in 3 to 5 days.  Return emergency department worsening.  Gargle with warm salt water.  Try over-the-counter omeprazole as this could be a silent reflux.  Take ibuprofen for the muscle pain you are having.  Drink plenty of water.  After a workout he should eat a protein snack to prevent your muscles from breaking down.

## 2019-07-28 NOTE — ED Provider Notes (Signed)
[facility] Emergency Department Provider Note  ____________________________________________   First MD Initiated Contact with Patient 07/28/19 1158     (approximate)  I have reviewed the triage vital signs and the nursing notes.   HISTORY  Chief Complaint Sore Throat    HPI Bethany Marshall is a 28 y.o. female presents emergency department complaint of sore throat since last Thursday.  She denies any fever or chills.  States her girlfriend had strep throat last week.  She denies any consistent cough or congestion.  She still has her sense of taste and smell.  She denies any abdominal pain, nausea vomiting or diarrhea.    Past Medical History:  Diagnosis Date  . Anemia   . Anemia   . Anxiety   . Dysmenorrhea   . Elevated LFTs   . History of iron deficiency anemia   . Malaise and fatigue   . Menorrhagia   . Migraine   . Obesity   . Palpitation   . Vitamin D deficiency   . Weight loss counseling, encounter for     Patient Active Problem List   Diagnosis Date Noted  . Iron deficiency anemia 05/16/2016  . Dysmenorrhea 12/08/2015  . Migraine without aura and responsive to treatment 12/08/2015  . Awareness of heartbeats 12/08/2015  . Vitamin D deficiency 12/08/2015  . Excessive and frequent menstruation 12/08/2015  . Palpitations 07/08/2014  . Paroxysmal supraventricular tachycardia ([place]) 07/08/2014    Past Surgical History:  Procedure Laterality Date  . iron fusion      Prior to Admission medications   Not on File    Allergies Patient has no known allergies.  Family History  Problem Relation Age of Onset  . Hypertension Father   . Hypothyroidism Mother     Social History Social History   Tobacco Use  . Smoking status: Never Smoker  . Smokeless tobacco: Never Used  Substance Use Topics  . Alcohol use: Yes    Alcohol/week: 0.0 standard drinks    Comment: Rare   . Drug use: No    Review of Systems  Constitutional: No  fever/chills Eyes: No visual changes. ENT: Positive sore throat. Respiratory: Denies cough Genitourinary: Negative for dysuria. Musculoskeletal: Negative for back pain. Skin: Negative for rash.    ____________________________________________   PHYSICAL EXAM:  VITAL SIGNS: ED Triage Vitals [07/28/19 1138]  Enc Vitals Group     BP 117/75     Pulse Rate 76     Resp 18     Temp 98.3 F (36.8 C)     Temp Source Oral     SpO2 99 %     Weight 165 lb (74.8 kg)     Height 5\' 4"  (1.626 m)     Head Circumference      Peak Flow      Pain Score 7     Pain Loc      Pain Edu?      Excl. in [place]?     Constitutional: Alert and oriented. Well appearing and in no acute distress. Eyes: Conjunctivae are normal.  Head: Atraumatic. Nose: No congestion/rhinnorhea. Mouth/Throat: Mucous membranes are moist.  Throat is not red or swollen Neck:  supple no lymphadenopathy noted Cardiovascular: Normal rate, regular rhythm. Heart sounds are normal Respiratory: Normal respiratory effort.  No retractions, lungs c t a  GU: deferred Musculoskeletal: FROM all extremities, warm and well perfused Neurologic:  Normal speech and language.  Skin:  Skin is warm, dry and intact. No  rash noted. Psychiatric: Mood and affect are normal. Speech and behavior are normal.  ____________________________________________   LABS (all labs ordered are listed, but only abnormal results are displayed)  Labs Reviewed  GROUP A STREP BY PCR   ____________________________________________   ____________________________________________  RADIOLOGY    ____________________________________________   PROCEDURES  Procedure(s) performed: No  Procedures    ____________________________________________   INITIAL IMPRESSION / ASSESSMENT AND PLAN / ED COURSE  Pertinent labs & imaging results that were available during my care of the patient were reviewed by me and considered in my medical decision making (see  chart for details).   Patient is a 28 year old female presents emergency department complaint of sore throat for several days.  Girlfriend was diagnosed with strep throat last week.  Physical exam shows throat to appear normal.  Remainder the exam is unremarkable   strep test was performed.  ----------------------------------------- 12:57 PM on 07/28/2019 -----------------------------------------  Strep test is negative.  Discussed the patient's muscle soreness through her chest.  Patient has been working out frequently.  Explained to her that this most likely is 5 the pectoral muscles in the trapezius muscle are hurting on and off.  Instructions were given to her on how to help prevent this.  She is to follow-up with regular doctor if not better in 3 days.  Return emergency department worsening.    Monico HoarHannah Loomer was evaluated in Emergency Department on 07/28/2019 for the symptoms described in the history of present illness. She was evaluated in the context of the global COVID-19 pandemic, which necessitated consideration that the patient might be at risk for infection with the SARS-CoV-2 virus that causes COVID-19. Institutional protocols and algorithms that pertain to the evaluation of patients at risk for COVID-19 are in a state of rapid change based on information released by regulatory bodies including the CDC and federal and state organizations. These policies and algorithms were followed during the patient's care in the ED.   As part of my medical decision making, I reviewed the following data within the electronic MEDICAL RECORD NUMBER Nursing notes reviewed and incorporated, Labs reviewed strep test negative, Old chart reviewed, Notes from prior ED visits and [place] Controlled Substance Database  ____________________________________________   FINAL CLINICAL IMPRESSION(S) / ED DIAGNOSES  Final diagnoses:  Sore throat  Muscle strain      NEW MEDICATIONS STARTED DURING THIS VISIT:  New  Prescriptions   No medications on file     Note:  This document was prepared using Dragon voice recognition software and may include unintentional dictation errors.    Faythe GheeFisher, Jeydan Barner W, PA-C 07/28/19 1258    Minna AntisPaduchowski, Kevin, MD 07/28/19 1420

## 2019-07-28 NOTE — ED Notes (Signed)
See triage note  States she developed sore throat last Thursday  No fever but also has had some chest discomfort for the past 3 weeks  Pain increased with cough and movement

## 2019-07-28 NOTE — ED Triage Notes (Addendum)
Pt to ED with c/o of sore throat since Thurs. Pt denies NVD. Pt states her GF was diagnosed with strep throat one week ago.  Pt also has c/o of chest pain with "certain movements" that started approx 3 weeks ago.

## 2019-09-04 ENCOUNTER — Emergency Department: Payer: Self-pay

## 2019-09-04 ENCOUNTER — Emergency Department
Admission: EM | Admit: 2019-09-04 | Discharge: 2019-09-04 | Disposition: A | Discharge status: Home / Self Care | Payer: Self-pay | Attending: Emergency Medicine | Admitting: Emergency Medicine

## 2019-09-04 ENCOUNTER — Other Ambulatory Visit: Payer: Self-pay

## 2019-09-04 DIAGNOSIS — N939 Abnormal uterine and vaginal bleeding, unspecified: Secondary | ICD-10-CM | POA: Insufficient documentation

## 2019-09-04 LAB — URINALYSIS, COMPLETE (UACMP) WITH MICROSCOPIC
Bacteria, UA: NONE SEEN
Bilirubin Urine: NEGATIVE
Glucose, UA: NEGATIVE mg/dL
Ketones, ur: NEGATIVE mg/dL
Leukocytes,Ua: NEGATIVE
Nitrite: NEGATIVE
Protein, ur: NEGATIVE mg/dL
Specific Gravity, Urine: 1.009 (ref 1.005–1.030)
pH: 6 (ref 5.0–8.0)

## 2019-09-04 LAB — CBC
HCT: 33.9 % — ABNORMAL LOW (ref 36.0–46.0)
Hemoglobin: 10.5 g/dL — ABNORMAL LOW (ref 12.0–15.0)
MCH: 24 pg — ABNORMAL LOW (ref 26.0–34.0)
MCHC: 31 g/dL (ref 30.0–36.0)
MCV: 77.6 fL — ABNORMAL LOW (ref 80.0–100.0)
Platelets: 280 10*3/uL (ref 150–400)
RBC: 4.37 MIL/uL (ref 3.87–5.11)
RDW: 16 % — ABNORMAL HIGH (ref 11.5–15.5)
WBC: 5.7 10*3/uL (ref 4.0–10.5)
nRBC: 0 % (ref 0.0–0.2)

## 2019-09-04 LAB — COMPREHENSIVE METABOLIC PANEL
ALT: 15 U/L (ref 0–44)
AST: 23 U/L (ref 15–41)
Albumin: 4.1 g/dL (ref 3.5–5.0)
Alkaline Phosphatase: 54 U/L (ref 38–126)
Anion gap: 9 (ref 5–15)
BUN: 7 mg/dL (ref 6–20)
CO2: 26 mmol/L (ref 22–32)
Calcium: 9.1 mg/dL (ref 8.9–10.3)
Chloride: 104 mmol/L (ref 98–111)
Creatinine, Ser: 1.15 mg/dL — ABNORMAL HIGH (ref 0.44–1.00)
GFR calc Af Amer: >60 mL/min (ref >60)
GFR calc non Af Amer: >60 mL/min (ref >60)
Glucose, Bld: 100 mg/dL — ABNORMAL HIGH (ref 70–99)
Potassium: 3.9 mmol/L (ref 3.5–5.1)
Sodium: 139 mmol/L (ref 135–145)
Total Bilirubin: 0.5 mg/dL (ref 0.3–1.2)
Total Protein: 7.9 g/dL (ref 6.5–8.1)

## 2019-09-04 LAB — LIPASE, BLOOD: Lipase: 38 U/L (ref 11–51)

## 2019-09-04 LAB — POCT PREGNANCY, URINE: Preg Test, Ur: NEGATIVE

## 2019-09-04 NOTE — ED Notes (Signed)
See triage note. Pt states she noted spotting on the 17th which has progressively gotten worse. Pt states heavy bleeding and cramping began on the 19th and at this time she is soaking a pad every 2 hours. LMP began on 26 September Pt states that her period is consistant and regular and this is off schedule. Pt reports accompanying nausea and low back pain. Denies difficulty urinating. Pt NAD and no respiratory Sx noted.

## 2019-09-04 NOTE — Discharge Instructions (Addendum)
Please seek medical attention for any high fevers, chest pain, shortness of breath, change in behavior, persistent vomiting, bloody stool or any other new or concerning symptoms.  

## 2019-09-04 NOTE — ED Triage Notes (Signed)
Pt arrives via pov, ambulatory to triage. Pt c/o right lower abd pain and vaginal bleeding starting on the 17th. Pt reports nausea, denies emesis. Reports she has cramping with periods but states "this is different". NAD noted at this time.

## 2019-09-04 NOTE — ED Notes (Signed)
Pt unable to urinate at this time, pt give specimen cup and instructed to give it to nurse when able to provide sample

## 2019-09-04 NOTE — ED Provider Notes (Signed)
[facility] Emergency Department Provider Note   ____________________________________________   I have reviewed the triage vital signs and the nursing notes.   HISTORY  Chief Complaint Abdominal Pain   History limited by: Not Limited   HPI Bethany Marshall is a 28 y.o. female who presents to the emergency department today with primary concern for unusual abdominal pain and vaginal bleeding. The patient states that the symptoms started 4 days ago. The patient says that this would be too early for her period and that this does not remind her of her period. She has had heavy vaginal bleeding. She has history of heavy vaginal bleeding but says that this bleeding is different. She was supposed to get an ultrasound over the summer but says she couldn't do it because of a work conflict. The patient has also had cramping which feels different from her normal period cramping. The patient has not yet tried contacting her ob/gyn. She also has complaints of noticing some bleeding with stooling today.   Records reviewed. Per medical record review patient has a history of dysmenorrhea.   Past Medical History:  Diagnosis Date  . Anemia   . Anemia   . Anxiety   . Dysmenorrhea   . Elevated LFTs   . History of iron deficiency anemia   . Malaise and fatigue   . Menorrhagia   . Migraine   . Obesity   . Palpitation   . Vitamin D deficiency   . Weight loss counseling, encounter for     Patient Active Problem List   Diagnosis Date Noted  . Iron deficiency anemia 05/16/2016  . Dysmenorrhea 12/08/2015  . Migraine without aura and responsive to treatment 12/08/2015  . Awareness of heartbeats 12/08/2015  . Vitamin D deficiency 12/08/2015  . Excessive and frequent menstruation 12/08/2015  . Palpitations 07/08/2014  . Paroxysmal supraventricular tachycardia (HCC) 07/08/2014    Past Surgical History:  Procedure Laterality Date  . iron fusion      Prior to Admission  medications   Not on File    Allergies Patient has no known allergies.  Family History  Problem Relation Age of Onset  . Hypertension Father   . Hypothyroidism Mother     Social History Social History   Tobacco Use  . Smoking status: Never Smoker  . Smokeless tobacco: Never Used  Substance Use Topics  . Alcohol use: Yes    Alcohol/week: 0.0 standard drinks    Comment: Rare   . Drug use: No    Review of Systems Constitutional: No fever/chills Eyes: No visual changes. ENT: No sore throat. Cardiovascular: Denies chest pain. Respiratory: Denies shortness of breath. Gastrointestinal: Positive for lower abdominal cramping. Positive for gi bleed Genitourinary: Positive for abnormal vaginal bleeding.  Musculoskeletal: Negative for back pain. Skin: Negative for rash. Neurological: Negative for headaches, focal weakness or numbness.  ____________________________________________   PHYSICAL EXAM:  VITAL SIGNS: ED Triage Vitals  Enc Vitals Group     BP 09/04/19 1711 120/80     Pulse Rate 09/04/19 1711 78     Resp 09/04/19 1711 17     Temp 09/04/19 1711 98.2 F (36.8 C)     Temp Source 09/04/19 1711 Oral     SpO2 09/04/19 1711 99 %     Weight 09/04/19 1619 165 lb (74.8 kg)     Height 09/04/19 1619 5\' 5"  (1.651 m)     Head Circumference --      Peak Flow --  Pain Score 09/04/19 1619 10   Constitutional: Alert and oriented.  Eyes: Conjunctivae are normal.  ENT      Head: Normocephalic and atraumatic.      Nose: No congestion/rhinnorhea.      Mouth/Throat: Mucous membranes are moist.      Neck: No stridor. Hematological/Lymphatic/Immunilogical: No cervical lymphadenopathy. Cardiovascular: Normal rate, regular rhythm.  No murmurs, rubs, or gallops.  Respiratory: Normal respiratory effort without tachypnea nor retractions. Breath sounds are clear and equal bilaterally. No wheezes/rales/rhonchi. Gastrointestinal: Soft, lower abdominal tenderness Rectal: No  hemorrhoid appreciated. No fissure.  Musculoskeletal: Normal range of motion in all extremities. No lower extremity edema. Neurologic:  Normal speech and language. No gross focal neurologic deficits are appreciated.  Skin:  Skin is warm, dry and intact. No rash noted. Psychiatric: Mood and affect are normal. Speech and behavior are normal. Patient exhibits appropriate insight and judgment.  ____________________________________________    LABS (pertinent positives/negatives)  Upreg negative CMP wnl except glu 100, cr 1.15 CBC wbc 5.7, hgb 10.5, plt 280 Lipase 38 UA clear, large hgb dipstick, 6-10 rbc, 0-5 wbc ____________________________________________   EKG  None  ____________________________________________    RADIOLOGY  [place]  Endometrial strip measured up to 9.4 mm. Otherwise normal.  ____________________________________________   PROCEDURES  Procedures  ____________________________________________   INITIAL IMPRESSION / ASSESSMENT AND PLAN / ED COURSE  Pertinent labs & imaging results that were available during my care of the patient were reviewed by me and considered in my medical decision making (see chart for details).   Patient presented to the emergency department today because of concerns for both vaginal bleeding and discomfort as well as possible GI bleed.  On exam and no hemorrhoids were appreciated.  Unclear if the rectal bleeding is truly rectal bleeding or vaginal blood.  Ultrasound was performed which did show a large endometrial stripe otherwise unremarkable.  I discussed this with the patient.  Did discuss could just be from the bleeding and that she would have to follow-up with her OB/GYN for repeat ultrasound.  Did offer to prescribe birth control to help with the bleeding however patient declined at this time. Blood work does show anemia however it is inline with patient's baseline anemia.  ____________________________________   FINAL CLINICAL  IMPRESSION(S) / ED DIAGNOSES  Final diagnoses:  Abnormal vaginal bleeding     Note: This dictation was prepared with Dragon dictation. Any transcriptional errors that result from this process are unintentional     Nance Pear, MD 09/04/19 2117

## 2019-10-11 ENCOUNTER — Other Ambulatory Visit: Payer: Self-pay

## 2019-10-11 ENCOUNTER — Emergency Department
Admission: EM | Admit: 2019-10-11 | Discharge: 2019-10-11 | Disposition: A | Discharge status: Home / Self Care | Payer: Self-pay | Attending: Emergency Medicine | Admitting: Emergency Medicine

## 2019-10-11 ENCOUNTER — Emergency Department
Admission: EM | Admit: 2019-10-11 | Discharge: 2019-10-11 | Disposition: A | Discharge status: Home / Self Care | Payer: Self-pay

## 2019-10-11 ENCOUNTER — Emergency Department: Payer: Self-pay

## 2019-10-11 DIAGNOSIS — W208XXA Other cause of strike by thrown, projected or falling object, initial encounter: Secondary | ICD-10-CM | POA: Insufficient documentation

## 2019-10-11 DIAGNOSIS — H5711 Ocular pain, right eye: Secondary | ICD-10-CM

## 2019-10-11 DIAGNOSIS — H53142 Visual discomfort, left eye: Secondary | ICD-10-CM

## 2019-10-11 DIAGNOSIS — H538 Other visual disturbances: Secondary | ICD-10-CM | POA: Insufficient documentation

## 2019-10-11 DIAGNOSIS — Y929 Unspecified place or not applicable: Secondary | ICD-10-CM | POA: Insufficient documentation

## 2019-10-11 DIAGNOSIS — Y93B3 Activity, free weights: Secondary | ICD-10-CM | POA: Insufficient documentation

## 2019-10-11 DIAGNOSIS — Y999 Unspecified external cause status: Secondary | ICD-10-CM | POA: Insufficient documentation

## 2019-10-11 DIAGNOSIS — H5712 Ocular pain, left eye: Secondary | ICD-10-CM | POA: Insufficient documentation

## 2019-10-11 MED ORDER — OXYCODONE HCL 5 MG PO TABS
5.0000 mg | ORAL_TABLET | Freq: Once | ORAL | Status: AC
  Administered 2019-10-11: 5 mg via ORAL
  Filled 2019-10-11: qty 1

## 2019-10-11 MED ORDER — NAPROXEN 500 MG PO TABS
500.0000 mg | ORAL_TABLET | Freq: Once | ORAL | Status: AC
  Administered 2019-10-11: 500 mg via ORAL
  Filled 2019-10-11: qty 1

## 2019-10-11 MED ORDER — TETRACAINE HCL 0.5 % OP SOLN
2.0000 [drp] | Freq: Once | OPHTHALMIC | Status: AC
  Administered 2019-10-11: 2 [drp] via OPHTHALMIC
  Filled 2019-10-11: qty 4

## 2019-10-11 MED ORDER — KETOROLAC TROMETHAMINE 0.5 % OP SOLN: 1.0000 [drp] | Freq: Four times a day (QID) | OPHTHALMIC | 0 refills | Status: DC

## 2019-10-11 MED ORDER — FLUORESCEIN SODIUM 1 MG OP STRP
1.0000 | ORAL_STRIP | Freq: Once | OPHTHALMIC | Status: AC
  Administered 2019-10-11: 1 via OPHTHALMIC
  Filled 2019-10-11: qty 1

## 2019-10-11 NOTE — Discharge Instructions (Signed)
Please call and schedule an appointment with ophthalmologist.   If the pain gets worse or you develop new symptoms over the weekend, return to the ER.

## 2019-10-11 NOTE — ED Provider Notes (Signed)
[facility] Emergency Department Provider Note ____________________________________________  Time seen: Approximately 8:40 PM  I have reviewed the triage vital signs and the nursing notes.   HISTORY  Chief Complaint Eye Injury   HPI Bethany Marshall is a 28 y.o. female presents emergency department for treatment and evaluation after a 25 pound weight dropped onto her face while she was working out.  Injury occurred last night.  Since that time, she has had pain in the left eye and face.  She states that her vision is blurred and she is unable to really keep her left eye open.  No alleviating measures prior to arrival.   Past Medical History:  Diagnosis Date  . Anemia   . Anemia   . Anxiety   . Dysmenorrhea   . Elevated LFTs   . History of iron deficiency anemia   . Malaise and fatigue   . Menorrhagia   . Migraine   . Obesity   . Palpitation   . Vitamin D deficiency   . Weight loss counseling, encounter for     Patient Active Problem List   Diagnosis Date Noted  . Iron deficiency anemia 05/16/2016  . Dysmenorrhea 12/08/2015  . Migraine without aura and responsive to treatment 12/08/2015  . Awareness of heartbeats 12/08/2015  . Vitamin D deficiency 12/08/2015  . Excessive and frequent menstruation 12/08/2015  . Palpitations 07/08/2014  . Paroxysmal supraventricular tachycardia (HCC) 07/08/2014    Past Surgical History:  Procedure Laterality Date  . iron fusion      Prior to Admission medications   Medication Sig Start Date End Date Taking? Authorizing Provider  ketorolac (ACULAR) 0.5 % ophthalmic solution Place 1 drop into the left eye 4 (four) times daily. 10/11/19   Chinita Pester, FNP    Allergies Patient has no known allergies.  Family History  Problem Relation Age of Onset  . Hypertension Father   . Hypothyroidism Mother     Social History Social History   Tobacco Use  . Smoking status: Never Smoker  . Smokeless tobacco:  Never Used  Substance Use Topics  . Alcohol use: Yes    Alcohol/week: 0.0 standard drinks    Comment: Rare   . Drug use: No    Review of Systems   Constitutional: No fever/chills Eyes: Positive for visual changes.  Positive for pain.  Negative for drainage. Musculoskeletal: Negative for pain. Skin: Negative for rash. Neurological: Negative for headaches, focal weakness or numbness. Allergic: Negative for seasonal allergies. ____________________________________________  PHYSICAL EXAM:  VITAL SIGNS: ED Triage Vitals  Enc Vitals Group     BP 10/11/19 1905 115/75     Pulse Rate 10/11/19 1905 72     Resp 10/11/19 1905 18     Temp 10/11/19 1905 98.2 F (36.8 C)     Temp Source 10/11/19 1905 Oral     SpO2 10/11/19 1905 100 %     Weight 10/11/19 1908 154 lb (69.9 kg)     Height 10/11/19 1908 5\' 5"  (1.651 m)     Head Circumference --      Peak Flow --      Pain Score 10/11/19 1908 7     Pain Loc --      Pain Edu? --      Excl. in [place]? --     Constitutional: Alert and oriented. Well appearing and in no acute distress. Eyes: Visual acuity--see nursing documentation; no globe trauma; Eyelids normal to inspection; Sclera appears anicteric.  Eyelids  not inverted. Conjunctiva appears erythematous; Cornea normal on fluorescein stain exam. Head: Atraumatic. Nose: No congestion/rhinnorhea. Mouth/Throat: Mucous membranes are moist.  Oropharynx non-erythematous. Respiratory: Respirations even and unlabored. Breath sounds clear to auscultation. Musculoskeletal:Normal ROM x 4 extremities. Neurologic:  Normal speech and language. No gross focal neurologic deficits are appreciated. Speech is normal. No gait instability. Skin:  Skin is warm, dry and intact. No rash noted. Psychiatric: Mood and affect are normal. Speech and behavior are normal.  ____________________________________________   LABS (all labs ordered are listed, but only abnormal results are displayed)  Labs Reviewed - No  data to display ____________________________________________  EKG  Not indicated ____________________________________________  RADIOLOGY  Not indicated ____________________________________________   PROCEDURES  Procedure(s) performed: None ____________________________________________   INITIAL IMPRESSION / ASSESSMENT AND PLAN / ED COURSE  28 year old female presenting to the emergency department several hours after she dropped a 25 pound weight on her face while working out.  Her left has been painful and sensitive to light all day.  See HPI for further details.  CT of the orbits is normal.  Globes, optic nerves, orbital fat and extraocular muscles are all intact.  Fluorescein stain exam was completed.  No corneal abrasion.  She will be medicated with Roxicet for tonight since pharmacies are closed and then will pick up a prescription for Acular tomorrow morning.  She was given strict ER return precautions and advised to call ophthalmology first thing Monday morning to request an appointment.  Pertinent labs & imaging results that were available during my care of the patient were reviewed by me and considered in my medical decision making (see chart for details). ____________________________________________   FINAL CLINICAL IMPRESSION(S) / ED DIAGNOSES  Final diagnoses:  Acute pain in right eye  Photophobia of left eye    Note:  This document was prepared using Dragon voice recognition software and may include unintentional dictation errors.    Victorino Dike, FNP 10/11/19 2057    Arta Silence, MD 10/11/19 2227

## 2019-10-11 NOTE — ED Triage Notes (Signed)
Pt to the er for injury to the left eye. Pt dropped a weight plate on that eye while working out. Pt reports blurry vision and pain with eye movement.

## 2019-12-31 ENCOUNTER — Other Ambulatory Visit (HOSPITAL_COMMUNITY)
Admission: RE | Admit: 2019-12-31 | Discharge: 2019-12-31 | Disposition: A | Discharge status: Home / Self Care | Payer: 59 | Source: Ambulatory Visit | Attending: Family Medicine | Admitting: Family Medicine

## 2019-12-31 ENCOUNTER — Other Ambulatory Visit: Payer: Self-pay

## 2019-12-31 ENCOUNTER — Ambulatory Visit (INDEPENDENT_AMBULATORY_CARE_PROVIDER_SITE_OTHER): Payer: 59 | Admitting: Family Medicine

## 2019-12-31 ENCOUNTER — Encounter: Payer: Self-pay | Admitting: Family Medicine

## 2019-12-31 VITALS — BP 112/70 | HR 100 | Temp 97.3°F | Resp 18 | Ht 65.0 in | Wt 148.9 lb

## 2019-12-31 DIAGNOSIS — F338 Other recurrent depressive disorders: Secondary | ICD-10-CM | POA: Diagnosis not present

## 2019-12-31 DIAGNOSIS — Z0001 Encounter for general adult medical examination with abnormal findings: Secondary | ICD-10-CM | POA: Diagnosis not present

## 2019-12-31 DIAGNOSIS — R634 Abnormal weight loss: Secondary | ICD-10-CM | POA: Diagnosis not present

## 2019-12-31 DIAGNOSIS — Z1159 Encounter for screening for other viral diseases: Secondary | ICD-10-CM

## 2019-12-31 DIAGNOSIS — D500 Iron deficiency anemia secondary to blood loss (chronic): Secondary | ICD-10-CM | POA: Diagnosis not present

## 2019-12-31 DIAGNOSIS — Z131 Encounter for screening for diabetes mellitus: Secondary | ICD-10-CM

## 2019-12-31 DIAGNOSIS — Z113 Encounter for screening for infections with a predominantly sexual mode of transmission: Secondary | ICD-10-CM | POA: Insufficient documentation

## 2019-12-31 DIAGNOSIS — E559 Vitamin D deficiency, unspecified: Secondary | ICD-10-CM

## 2019-12-31 DIAGNOSIS — E538 Deficiency of other specified B group vitamins: Secondary | ICD-10-CM

## 2019-12-31 DIAGNOSIS — I471 Supraventricular tachycardia: Secondary | ICD-10-CM

## 2019-12-31 DIAGNOSIS — N920 Excessive and frequent menstruation with regular cycle: Secondary | ICD-10-CM

## 2019-12-31 DIAGNOSIS — Z3009 Encounter for other general counseling and advice on contraception: Secondary | ICD-10-CM

## 2019-12-31 DIAGNOSIS — Z124 Encounter for screening for malignant neoplasm of cervix: Secondary | ICD-10-CM

## 2019-12-31 DIAGNOSIS — Z13220 Encounter for screening for lipoid disorders: Secondary | ICD-10-CM

## 2019-12-31 DIAGNOSIS — N946 Dysmenorrhea, unspecified: Secondary | ICD-10-CM

## 2019-12-31 DIAGNOSIS — Z0000 Encounter for general adult medical examination without abnormal findings: Secondary | ICD-10-CM

## 2019-12-31 MED ORDER — CITALOPRAM HYDROBROMIDE 20 MG PO TABS: 20.0000 mg | ORAL_TABLET | Freq: Every day | ORAL | 1 refills | Status: DC
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
## 2019-12-31 NOTE — Patient Instructions (Signed)
Preventive Care 21-31 Years Old, Female Preventive care refers to visits with your health care provider and lifestyle choices that can promote health and wellness. This includes:  A yearly physical exam. This may also be called an annual well check.  Regular dental visits and eye exams.  Immunizations.  Screening for certain conditions.  Healthy lifestyle choices, such as eating a healthy diet, getting regular exercise, not using drugs or products that contain nicotine and tobacco, and limiting alcohol use. What can I expect for my preventive care visit? Physical exam Your health care provider will check your:  Height and weight. This may be used to calculate body mass index (BMI), which tells if you are at a healthy weight.  Heart rate and blood pressure.  Skin for abnormal spots. Counseling Your health care provider may ask you questions about your:  Alcohol, tobacco, and drug use.  Emotional well-being.  Home and relationship well-being.  Sexual activity.  Eating habits.  Work and work environment.  Method of birth control.  Menstrual cycle.  Pregnancy history. What immunizations do I need?  Influenza (flu) vaccine  This is recommended every year. Tetanus, diphtheria, and pertussis (Tdap) vaccine  You may need a Td booster every 10 years. Varicella (chickenpox) vaccine  You may need this if you have not been vaccinated. Human papillomavirus (HPV) vaccine  If recommended by your health care provider, you may need three doses over 6 months. Measles, mumps, and rubella (MMR) vaccine  You may need at least one dose of MMR. You may also need a second dose. Meningococcal conjugate (MenACWY) vaccine  One dose is recommended if you are age 19-21 years and a first-year college student living in a residence hall, or if you have one of several medical conditions. You may also need additional booster doses. Pneumococcal conjugate (PCV13) vaccine  You may need  this if you have certain conditions and were not previously vaccinated. Pneumococcal polysaccharide (PPSV23) vaccine  You may need one or two doses if you smoke cigarettes or if you have certain conditions. Hepatitis A vaccine  You may need this if you have certain conditions or if you travel or work in places where you may be exposed to hepatitis A. Hepatitis B vaccine  You may need this if you have certain conditions or if you travel or work in places where you may be exposed to hepatitis B. Haemophilus influenzae type b (Hib) vaccine  You may need this if you have certain conditions. You may receive vaccines as individual doses or as more than one vaccine together in one shot (combination vaccines). Talk with your health care provider about the risks and benefits of combination vaccines. What tests do I need?  Blood tests  Lipid and cholesterol levels. These may be checked every 5 years starting at age 20.  Hepatitis C test.  Hepatitis B test. Screening  Diabetes screening. This is done by checking your blood sugar (glucose) after you have not eaten for a while (fasting).  Sexually transmitted disease (STD) testing.  BRCA-related cancer screening. This may be done if you have a family history of breast, ovarian, tubal, or peritoneal cancers.  Pelvic exam and Pap test. This may be done every 3 years starting at age 21. Starting at age 30, this may be done every 5 years if you have a Pap test in combination with an HPV test. Talk with your health care provider about your test results, treatment options, and if necessary, the need for more tests.   Follow these instructions at home: Eating and drinking   Eat a diet that includes fresh fruits and vegetables, whole grains, lean protein, and low-fat dairy.  Take vitamin and mineral supplements as recommended by your health care provider.  Do not drink alcohol if: ? Your health care provider tells you not to drink. ? You are  pregnant, may be pregnant, or are planning to become pregnant.  If you drink alcohol: ? Limit how much you have to 0-1 drink a day. ? Be aware of how much alcohol is in your drink. In the [place] one drink equals one 12 oz bottle of beer (355 mL), one 5 oz glass of wine (148 mL), or one 1 oz glass of hard liquor (44 mL). Lifestyle  Take daily care of your teeth and gums.  Stay active. Exercise for at least 30 minutes on 5 or more days each week.  Do not use any products that contain nicotine or tobacco, such as cigarettes, e-cigarettes, and chewing tobacco. If you need help quitting, ask your health care provider.  If you are sexually active, practice safe sex. Use a condom or other form of birth control (contraception) in order to prevent pregnancy and STIs (sexually transmitted infections). If you plan to become pregnant, see your health care provider for a preconception visit. What's next?  Visit your health care provider once a year for a well check visit.  Ask your health care provider how often you should have your eyes and teeth checked.  Stay up to date on all vaccines. This information is not intended to replace advice given to you by your health care provider. Make sure you discuss any questions you have with your health care provider. Document Revised: 07/12/2018 Document Reviewed: 07/12/2018 Elsevier Patient Education  2020 [employer].

## 2020-01-01 LAB — COMPLETE METABOLIC PANEL WITH GFR
AG Ratio: 1.7 (calc) (ref 1.0–2.5)
ALT: 13 U/L (ref 6–29)
AST: 19 U/L (ref 10–30)
Albumin: 4.5 g/dL (ref 3.6–5.1)
Alkaline phosphatase (APISO): 46 U/L (ref 31–125)
BUN/Creatinine Ratio: 6 (calc) (ref 6–22)
BUN: 6 mg/dL — ABNORMAL LOW (ref 7–25)
CO2: 28 mmol/L (ref 20–32)
Calcium: 9.8 mg/dL (ref 8.6–10.2)
Chloride: 104 mmol/L (ref 98–110)
Creat: 1 mg/dL (ref 0.50–1.10)
GFR, Est African American: 88 mL/min/{1.73_m2} (ref >=60)
GFR, Est Non African American: 76 mL/min/{1.73_m2} (ref >=60)
Globulin: 2.7 g/dL (calc) (ref 1.9–3.7)
Glucose, Bld: 78 mg/dL (ref 65–99)
Potassium: 3.7 mmol/L (ref 3.5–5.3)
Sodium: 139 mmol/L (ref 135–146)
Total Bilirubin: 0.7 mg/dL (ref 0.2–1.2)
Total Protein: 7.2 g/dL (ref 6.1–8.1)

## 2020-01-01 LAB — CBC WITH DIFFERENTIAL/PLATELET
Absolute Monocytes: 333 cells/uL (ref 200–950)
Basophils Absolute: 19 cells/uL (ref 0–200)
Basophils Relative: 0.5 %
Eosinophils Absolute: 41 cells/uL (ref 15–500)
Eosinophils Relative: 1.1 %
HCT: 34.3 % — ABNORMAL LOW (ref 35.0–45.0)
Hemoglobin: 10.6 g/dL — ABNORMAL LOW (ref 11.7–15.5)
Lymphs Abs: 1550 cells/uL (ref 850–3900)
MCH: 23.9 pg — ABNORMAL LOW (ref 27.0–33.0)
MCHC: 30.9 g/dL — ABNORMAL LOW (ref 32.0–36.0)
MCV: 77.4 fL — ABNORMAL LOW (ref 80.0–100.0)
MPV: 12.1 fL (ref 7.5–12.5)
Monocytes Relative: 9 %
Neutro Abs: 1758 cells/uL (ref 1500–7800)
Neutrophils Relative %: 47.5 %
Platelets: 322 10*3/uL (ref 140–400)
RBC: 4.43 10*6/uL (ref 3.80–5.10)
RDW: 15.5 % — ABNORMAL HIGH (ref 11.0–15.0)
Total Lymphocyte: 41.9 %
WBC: 3.7 10*3/uL — ABNORMAL LOW (ref 3.8–10.8)

## 2020-01-01 LAB — HEPATITIS C ANTIBODY
Hepatitis C Ab: NONREACTIVE
SIGNAL TO CUT-OFF: 0.01 (ref <1.00)

## 2020-01-01 LAB — LIPID PANEL
Cholesterol: 154 mg/dL (ref <200)
HDL: 64 mg/dL (ref >=50)
LDL Cholesterol (Calc): 77 mg/dL (calc)
Non-HDL Cholesterol (Calc): 90 mg/dL (calc) (ref <130)
Total CHOL/HDL Ratio: 2.4 (calc) (ref <5.0)
Triglycerides: 47 mg/dL (ref <150)

## 2020-01-01 LAB — RPR: RPR Ser Ql: NONREACTIVE

## 2020-01-01 LAB — HEMOGLOBIN A1C
Hgb A1c MFr Bld: 5 % of total Hgb (ref <5.7)
Mean Plasma Glucose: 97 (calc)
eAG (mmol/L): 5.4 (calc)

## 2020-01-01 LAB — TSH: TSH: 1.28 mIU/L

## 2020-01-01 LAB — HIV ANTIBODY (ROUTINE TESTING W REFLEX): HIV 1&2 Ab, 4th Generation: NONREACTIVE

## 2020-01-01 LAB — VITAMIN B12: Vitamin B-12: 488 pg/mL (ref 200–1100)

## 2020-01-01 LAB — VITAMIN D 25 HYDROXY (VIT D DEFICIENCY, FRACTURES): Vit D, 25-Hydroxy: 36 ng/mL (ref 30–100)

## 2020-01-03 ENCOUNTER — Ambulatory Visit: Payer: 59 | Admitting: Certified Nurse Midwife

## 2020-01-06 LAB — CYTOLOGY - PAP
Chlamydia: NEGATIVE
Comment: NEGATIVE
Comment: NEGATIVE
Comment: NEGATIVE
Comment: NORMAL
Diagnosis: NEGATIVE
High risk HPV: NEGATIVE
Neisseria Gonorrhea: NEGATIVE
Trichomonas: NEGATIVE

## 2020-02-21 ENCOUNTER — Encounter: Payer: Self-pay | Admitting: Family Medicine

## 2020-03-04 ENCOUNTER — Encounter: Payer: Self-pay | Admitting: Family Medicine

## 2020-03-04 ENCOUNTER — Ambulatory Visit (INDEPENDENT_AMBULATORY_CARE_PROVIDER_SITE_OTHER): Payer: 59 | Admitting: Family Medicine

## 2020-03-04 ENCOUNTER — Other Ambulatory Visit: Payer: Self-pay

## 2020-03-04 VITALS — BP 104/70 | HR 95 | Temp 97.3°F | Resp 16 | Ht 65.0 in | Wt 154.3 lb

## 2020-03-04 DIAGNOSIS — F338 Other recurrent depressive disorders: Secondary | ICD-10-CM

## 2020-03-04 DIAGNOSIS — E538 Deficiency of other specified B group vitamins: Secondary | ICD-10-CM | POA: Diagnosis not present

## 2020-03-04 DIAGNOSIS — N920 Excessive and frequent menstruation with regular cycle: Secondary | ICD-10-CM

## 2020-03-04 DIAGNOSIS — N946 Dysmenorrhea, unspecified: Secondary | ICD-10-CM

## 2020-03-04 DIAGNOSIS — D500 Iron deficiency anemia secondary to blood loss (chronic): Secondary | ICD-10-CM | POA: Diagnosis not present

## 2020-03-04 NOTE — Progress Notes (Signed)
Name: Bethany Marshall   MRN: [id number]    DOB: 04/29/1991   Date:03/04/2020       Progress Note  Subjective  Chief Complaint  Chief Complaint  Patient presents with  . Fatigue    HPI  Chronic blood loss due to heavy cycles: explained that she needs to do something to stop having a cycle and control her anemia. . Discussed ocp's, nuvaring, depo and IUD - mirena she would like to be referred to get the IUD  Fatigue: we will recheck labs again, she states she is always tired, reviewed all labs done in Feb and normal, we will recheck iron studies and CBC first  Seasonal Affective Disorder: she always feels down during winter months, however she states since Nov she has been feeling down. Worse depression she ever had, the only change was the end of a long term relationship ( broke up after 1 year). She states she lost motivation and stopped working out, she has been losing a lot of weight since. She is concerned about her weight loss. She has lost 20 lbs since Nov. She started eating again in Feb , she has been feeling better, going to the gym after work because if she goes home she does not have the motivation to go afterwards. Lives alone. Explained that the evenings can be lonely. Discussed starting medication since phq 9 is still high at 15, but she refuses it. Also discussed counseling   Patient Active Problem List   Diagnosis Date Noted  . Seasonal affective disorder ([place]) 12/31/2019  . Iron deficiency anemia 05/16/2016  . Dysmenorrhea 12/08/2015  . Migraine without aura and responsive to treatment 12/08/2015  . Awareness of heartbeats 12/08/2015  . Vitamin D deficiency 12/08/2015  . Excessive and frequent menstruation 12/08/2015  . Palpitations 07/08/2014  . Paroxysmal supraventricular tachycardia ([place]) 07/08/2014    Past Surgical History:  Procedure Laterality Date  . iron fusion      Family History  Problem Relation Age of Onset  . Hypertension Father   . Hypothyroidism  Mother     Social History   Tobacco Use  . Smoking status: Never Smoker  . Smokeless tobacco: Never Used  Substance Use Topics  . Alcohol use: Yes    Alcohol/week: 0.0 standard drinks    Comment: Rare      Current Outpatient Medications:  .  citalopram (CELEXA) 20 MG tablet, Take 1 tablet (20 mg total) by mouth daily. (Patient not taking: Reported on 03/04/2020), Disp: 30 tablet, Rfl: 1  No Known Allergies  I personally reviewed active problem list, medication list, allergies, family history, social history, health maintenance with the patient/caregiver today.   ROS  Constitutional: Negative for fever or weight change.  Respiratory: Negative for cough and shortness of breath.   Cardiovascular: Negative for chest pain or palpitations.  Gastrointestinal: Negative for abdominal pain, no bowel changes.  Musculoskeletal: Negative for gait problem or joint swelling.  Skin: Negative for rash.  Neurological: Negative for dizziness or headache.  No other specific complaints in a complete review of systems (except as listed in HPI above).  Objective  Vitals:   03/04/20 0748  BP: 104/70  Pulse: 95  Resp: 16  Temp: (!) 97.3 F (36.3 C)  TempSrc: Temporal  SpO2: 100%  Weight: 154 lb 4.8 oz (70 kg)  Height: 5\' 5"  (1.651 m)    Body mass index is 25.68 kg/m.  Physical Exam  Constitutional: Patient appears well-developed and well-nourished. No distress.  HEENT: head  atraumatic, normocephalic, pupils equal and reactive to light Cardiovascular: Normal rate, regular rhythm and normal heart sounds.  No murmur heard. No BLE edema. Pulmonary/Chest: Effort normal and breath sounds normal. No respiratory distress. Abdominal: Soft.  There is no tenderness. Psychiatric: Patient has a normal mood and affect. behavior is normal. Judgment and thought content normal.  Recent Results (from the past 2160 hour(s))  Cytology - PAP     Status: None   Collection Time: 12/31/19  3:08 PM   Result Value Ref Range   High risk HPV Negative    Neisseria Gonorrhea Negative    Chlamydia Negative    Trichomonas Negative    Adequacy      Satisfactory for evaluation; transformation zone component PRESENT.   Diagnosis      - Negative for intraepithelial lesion or malignancy (NILM)   Comment Normal Reference Range Trichomonas - Negative    Comment Normal Reference Range HPV - Negative    Comment Normal Reference Ranger Chlamydia - Negative    Comment      Normal Reference Range Neisseria Gonorrhea - Negative  Lipid panel     Status: None   Collection Time: 12/31/19  3:35 PM  Result Value Ref Range   Cholesterol 154 <200 mg/dL   HDL 64 > OR = 50 mg/dL   Triglycerides 47 <161<150 mg/dL   LDL Cholesterol (Calc) 77 mg/dL (calc)    Comment: Reference range: <100 . Desirable range <100 mg/dL for primary prevention;   <70 mg/dL for patients with CHD or diabetic patients  with > or = 2 CHD risk factors. Marland Kitchen. LDL-C is now calculated using the Martin-Hopkins  calculation, which is a validated novel method providing  better accuracy than the Friedewald equation in the  estimation of LDL-C.  Horald PollenMartin SS et al. Lenox AhrJAMA. 0960;454(092013;310(19): 2061-2068  (http://education.[facility].com/faq/FAQ164)    Total CHOL/HDL Ratio 2.4 <5.0 (calc)   Non-HDL Cholesterol (Calc) 90 <811<130 mg/dL (calc)    Comment: For patients with diabetes plus 1 major ASCVD risk  factor, treating to a non-HDL-C goal of <100 mg/dL  (LDL-C of <91<70 mg/dL) is considered a therapeutic  option.   Vitamin B12     Status: None   Collection Time: 12/31/19  3:35 PM  Result Value Ref Range   Vitamin B-12 488 200 - 1,100 pg/mL  VITAMIN D 25 Hydroxy (Vit-D Deficiency, Fractures)     Status: None   Collection Time: 12/31/19  3:35 PM  Result Value Ref Range   Vit D, 25-Hydroxy 36 30 - 100 ng/mL    Comment: Vitamin D Status         25-OH Vitamin D: . Deficiency:                    <20 ng/mL Insufficiency:             20 - 29  ng/mL Optimal:                 > or = 30 ng/mL . For 25-OH Vitamin D testing on patients on  D2-supplementation and patients for whom quantitation  of D2 and D3 fractions is required, the QuestAssureD(TM) 25-OH VIT D, (D2,D3), LC/MS/MS is recommended: order  code [id number] (patients >3874yrs). See Note 1 . Note 1 . For additional information, please refer to  http://education.[facility].com/faq/FAQ199  (This link is being provided for informational/ educational purposes only.)   TSH     Status: None   Collection Time: 12/31/19  3:35 PM  Result  Value Ref Range   TSH 1.28 mIU/L    Comment:           Reference Range .           > or = 20 Years  0.40-4.50 .                Pregnancy Ranges           First trimester    0.26-2.66           Second trimester   0.55-2.73           Third trimester    0.43-2.91   Hemoglobin A1c     Status: None   Collection Time: 12/31/19  3:35 PM  Result Value Ref Range   Hgb A1c MFr Bld 5.0 <5.7 % of total Hgb    Comment: For the purpose of screening for the presence of diabetes: . <5.7%       Consistent with the absence of diabetes 5.7-6.4%    Consistent with increased risk for diabetes             (prediabetes) > or =6.5%  Consistent with diabetes . This assay result is consistent with a decreased risk of diabetes. . Currently, no consensus exists regarding use of hemoglobin A1c for diagnosis of diabetes in children. . According to American Diabetes Association (ADA) guidelines, hemoglobin A1c <7.0% represents optimal control in non-pregnant diabetic patients. Different metrics may apply to specific patient populations.  Standards of Medical Care in Diabetes(ADA). .    Mean Plasma Glucose 97 (calc)   eAG (mmol/L) 5.4 (calc)  HIV Antibody (routine testing w rflx)     Status: None   Collection Time: 12/31/19  3:35 PM  Result Value Ref Range   HIV 1&2 Ab, 4th Generation NON-REACTIVE NON-REACTI    Comment: HIV-1 antigen and HIV-1/HIV-2  antibodies were not detected. There is no laboratory evidence of HIV infection. Marland Kitchen PLEASE NOTE: This information has been disclosed to you from records whose confidentiality may be protected by state law.  If your state requires such protection, then the state law prohibits you from making any further disclosure of the information without the specific written consent of the person to whom it pertains, or as otherwise permitted by law. A general authorization for the release of medical or other information is NOT sufficient for this purpose. . For additional information please refer to http://education.[facility].com/faq/FAQ106 (This link is being provided for informational/ educational purposes only.) . Marland Kitchen The performance of this assay has not been clinically validated in patients less than 36 years old. .   Hepatitis C antibody     Status: None   Collection Time: 12/31/19  3:35 PM  Result Value Ref Range   Hepatitis C Ab NON-REACTIVE NON-REACTI   SIGNAL TO CUT-OFF 0.01 <1.00    Comment: . HCV antibody was non-reactive. There is no laboratory  evidence of HCV infection. . In most cases, no further action is required. However, if recent HCV exposure is suspected, a test for HCV RNA (test code 02637) is suggested. . For additional information please refer to http://education.[facility].com/faq/FAQ22v1 (This link is being provided for informational/ educational purposes only.) .   CBC with Differential/Platelet     Status: Abnormal   Collection Time: 12/31/19  3:35 PM  Result Value Ref Range   WBC 3.7 (L) 3.8 - 10.8 Thousand/uL   RBC 4.43 3.80 - 5.10 Million/uL   Hemoglobin 10.6 (L) 11.7 - 15.5 g/dL   HCT  34.3 (L) 35.0 - 45.0 %   MCV 77.4 (L) 80.0 - 100.0 fL   MCH 23.9 (L) 27.0 - 33.0 pg   MCHC 30.9 (L) 32.0 - 36.0 g/dL   RDW 40.0 (H) 86.7 - 61.9 %   Platelets 322 140 - 400 Thousand/uL   MPV 12.1 7.5 - 12.5 fL   Neutro Abs 1,758 1,500 - 7,800 cells/uL    Lymphs Abs 1,550 850 - 3,900 cells/uL   Absolute Monocytes 333 200 - 950 cells/uL   Eosinophils Absolute 41 15 - 500 cells/uL   Basophils Absolute 19 0 - 200 cells/uL   Neutrophils Relative % 47.5 %   Total Lymphocyte 41.9 %   Monocytes Relative 9.0 %   Eosinophils Relative 1.1 %   Basophils Relative 0.5 %  COMPLETE METABOLIC PANEL WITH GFR     Status: Abnormal   Collection Time: 12/31/19  3:35 PM  Result Value Ref Range   Glucose, Bld 78 65 - 99 mg/dL    Comment: .            Fasting reference interval .    BUN 6 (L) 7 - 25 mg/dL   Creat 5.09 3.26 - 7.12 mg/dL   GFR, Est Non African American 76 > OR = 60 mL/min/1.20m2   GFR, Est African American 88 > OR = 60 mL/min/1.2m2   BUN/Creatinine Ratio 6 6 - 22 (calc)   Sodium 139 135 - 146 mmol/L   Potassium 3.7 3.5 - 5.3 mmol/L   Chloride 104 98 - 110 mmol/L   CO2 28 20 - 32 mmol/L   Calcium 9.8 8.6 - 10.2 mg/dL   Total Protein 7.2 6.1 - 8.1 g/dL   Albumin 4.5 3.6 - 5.1 g/dL   Globulin 2.7 1.9 - 3.7 g/dL (calc)   AG Ratio 1.7 1.0 - 2.5 (calc)   Total Bilirubin 0.7 0.2 - 1.2 mg/dL   Alkaline phosphatase (APISO) 46 31 - 125 U/L   AST 19 10 - 30 U/L   ALT 13 6 - 29 U/L  RPR     Status: None   Collection Time: 12/31/19  3:35 PM  Result Value Ref Range   RPR Ser Ql NON-REACTIVE NON-REACTI     PHQ2/9: Depression screen [facility] 2/9 03/04/2020 12/31/2019 01/11/2017 03/16/2016 12/08/2015  Decreased Interest 2 1 0 0 0  Down, Depressed, Hopeless 0 0 0 0 0  PHQ - 2 Score 2 1 0 0 0  Altered sleeping 3 3 - - -  Tired, decreased energy 3 3 - - -  Change in appetite 3 3 - - -  Feeling bad or failure about yourself  0 0 - - -  Trouble concentrating 2 3 - - -  Moving slowly or fidgety/restless 2 3 - - -  Suicidal thoughts 0 0 - - -  PHQ-9 Score 15 16 - - -  Difficult doing work/chores Very difficult Somewhat difficult - - -    phq 9 is positive   Fall Risk: Fall Risk  03/04/2020 12/31/2019 01/11/2017 03/16/2016 12/08/2015  Falls in the past  year? 0 0 No No No  Number falls in past yr: 0 0 - - -  Injury with Fall? 0 0 - - -  Follow up - Falls evaluation completed - - -     Functional Status Survey: Is the patient deaf or have difficulty hearing?: No Does the patient have difficulty seeing, even when wearing glasses/contacts?: No Does the patient have difficulty concentrating, remembering, or  making decisions?: Yes Does the patient have difficulty walking or climbing stairs?: No Does the patient have difficulty dressing or bathing?: No Does the patient have difficulty doing errands alone such as visiting a doctor's office or shopping?: No    Assessment & Plan  1. Iron deficiency anemia due to chronic blood loss  - CBC with Differential/Platelet - Iron, TIBC and Ferritin Panel - Ambulatory referral to Obstetrics / Gynecology  2. Dysmenorrhea  Needs to see gyn   3. Low serum vitamin B12  Continue B12 supplementation and resume vitamin D otc   4. Menorrhagia with regular cycle  - Ambulatory referral to Obstetrics / Gynecology  5. Seasonal affective disorder (HCC)  She does not want medication, likely MDD

## 2020-03-05 LAB — IRON,TIBC AND FERRITIN PANEL
%SAT: 7 % (calc) — ABNORMAL LOW (ref 16–45)
Ferritin: 3 ng/mL — ABNORMAL LOW (ref 16–154)
Iron: 27 ug/dL — ABNORMAL LOW (ref 40–190)
TIBC: 390 mcg/dL (calc) (ref 250–450)

## 2020-03-05 LAB — CBC WITH DIFFERENTIAL/PLATELET
Absolute Monocytes: 342 cells/uL (ref 200–950)
Basophils Absolute: 19 cells/uL (ref 0–200)
Basophils Relative: 0.5 %
Eosinophils Absolute: 49 cells/uL (ref 15–500)
Eosinophils Relative: 1.3 %
HCT: 34.2 % — ABNORMAL LOW (ref 35.0–45.0)
Hemoglobin: 10.4 g/dL — ABNORMAL LOW (ref 11.7–15.5)
Lymphs Abs: 1604 cells/uL (ref 850–3900)
MCH: 23.5 pg — ABNORMAL LOW (ref 27.0–33.0)
MCHC: 30.4 g/dL — ABNORMAL LOW (ref 32.0–36.0)
MCV: 77.4 fL — ABNORMAL LOW (ref 80.0–100.0)
MPV: 11.6 fL (ref 7.5–12.5)
Monocytes Relative: 9 %
Neutro Abs: 1786 cells/uL (ref 1500–7800)
Neutrophils Relative %: 47 %
Platelets: 283 10*3/uL (ref 140–400)
RBC: 4.42 10*6/uL (ref 3.80–5.10)
RDW: 14.6 % (ref 11.0–15.0)
Total Lymphocyte: 42.2 %
WBC: 3.8 10*3/uL (ref 3.8–10.8)

## 2020-03-23 ENCOUNTER — Ambulatory Visit: Payer: 59 | Admitting: Certified Nurse Midwife

## 2020-03-30 ENCOUNTER — Ambulatory Visit: Payer: 59 | Admitting: Certified Nurse Midwife

## 2020-03-30 ENCOUNTER — Telehealth: Payer: Self-pay | Admitting: Certified Nurse Midwife

## 2020-03-30 NOTE — Telephone Encounter (Signed)
Called pt to resched last appt.Bethany Marshall [email or web address]

## 2020-08-13 ENCOUNTER — Emergency Department
Admission: EM | Admit: 2020-08-13 | Discharge: 2020-08-13 | Disposition: A | Discharge status: Home / Self Care | Payer: 59 | Attending: Emergency Medicine | Admitting: Emergency Medicine

## 2020-08-13 ENCOUNTER — Other Ambulatory Visit: Payer: Self-pay

## 2020-08-13 ENCOUNTER — Emergency Department: Payer: 59

## 2020-08-13 DIAGNOSIS — M76811 Anterior tibial syndrome, right leg: Secondary | ICD-10-CM | POA: Insufficient documentation

## 2020-08-13 MED ORDER — METHOCARBAMOL 500 MG PO TABS: 500.0000 mg | ORAL_TABLET | Freq: Three times a day (TID) | ORAL | 0 refills | Status: AC | PRN

## 2020-08-13 MED ORDER — MELOXICAM 15 MG PO TABS: 15.0000 mg | ORAL_TABLET | Freq: Every day | ORAL | 1 refills | Status: AC
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
## 2020-08-13 NOTE — Discharge Instructions (Signed)
Take Meloxicam and Robaxin for pain and inflammation.

## 2020-09-04 ENCOUNTER — Encounter: Payer: 59 | Admitting: Certified Nurse Midwife

## 2020-09-07 ENCOUNTER — Encounter: Payer: 59 | Admitting: Certified Nurse Midwife

## 2020-09-17 NOTE — Progress Notes (Signed)
Patient ID: Bethany Marshall, female    DOB: 1990/12/11, 29 y.o.   MRN: [id number]  PCP: Alba Cory, MD  Chief Complaint  Patient presents with  . Tingling  . Focusing    Subjective:   Bethany Marshall is a 29 y.o. female, presents to clinic with CC of the following:  Chief Complaint  Patient presents with  . Tingling  . Focusing    HPI:  Patient is a 29 year old female patient of Dr. Carlynn Purl Last visit with her was in April 2021. Patient follows up today with the above complaints, was late for her appointment  She was referred to OB/GYN after her last visit with Dr. Carlynn Purl for iron deficiency anemia secondary to menorrhagia, patient never followed up with OB/GYN. Also, after labs done on the last visit with Dr. Carlynn Purl, the following was recommended to the patient:  Anemia is stable but iron storage is lower, you need to consider going back to hematologist for iron infusion. She was waiting for a referral or to be called and never happened. Not taking any iron supplements since either.  The following were issues noted at that last visit with Dr. Carlynn Purl: Fatigue: we will recheck labs again, she states she is always tired, reviewed all labs done in Feb and normal, we will recheck iron studies and CBC first  Seasonal Affective Disorder: she always feels down during winter months, however she states since Nov she has been feeling down. Worse depression she ever had, the only change was the end of a long term relationship ( broke up after 1 year). She states she lost motivation and stopped working out, she has been losing a lot of weight since. She is concerned about her weight loss. She has lost 20 lbs since Nov. She started eating again in Feb , she has been feeling better, going to the gym after work because if she goes home she does not have the motivation to go afterwards. Lives alone. Explained that the evenings can be lonely. Discussed starting medication since phq 9 is still  high at 15, but she refuses it. Also discussed counseling   Iron def anemia  Lab Results  Component Value Date   WBC 3.8 03/04/2020   HGB 10.4 (L) 03/04/2020   HCT 34.2 (L) 03/04/2020   MCV 77.4 (L) 03/04/2020   PLT 283 03/04/2020   Lab Results  Component Value Date   IRON 27 (L) 03/04/2020   TIBC 390 03/04/2020   FERRITIN 3 (L) 03/04/2020   Also noted a history of vitamin B12 deficiency, although her last vitamin B12 was normal at 488.  She noted today intermittent tingling in left arm and ocass in right arm, never at the same time, sleeps on stomach or back, not a side sleeper. Workouts a little more intense and power lifts, five times a week does lifting, 3 days heavy lifting, does dead lifts, squats and bench.  Noted when does her workouts, she wears a belt and wrist wraps on both wrists, usually has them on for at least 1-1/2 to 2 hours with the workouts.  Not the whole arm often, and often the hands only, more in the distal fingertip regions, and not only on one specific side.  Not dropping things, notes right wrist weaker for 3-4 months, but denies any problems with her lifting activities with weakness or dropping things.  Has not had any major trauma or injury with her workouts in the recent past.  Denied hands being  cold in recent past.  No color changes of the hands from red to white to blue concerns.  No h/o thyroid disease, denies personal history, recent TSH checks good. Lab Results  Component Value Date   TSH 1.28 12/31/2019   Never has much energy, uses energy drinks 2X/day to help, tired all the time. Periods normal, very heavy.  Never followed up with Ob/Gyn, decided against having an IUD, MS, and not felt like she needed to follow-up.  I did note to her the follow-up was to help with her heavy periods as well.. No chance pregnant, noted is "gay"  Two weeks ago had right hand swollen without obvious cause, no trauma, no insect bite and the next day much improved, not  recurred since  No tob history  Denies major depression concerns presently, states in a really good place now and not experiencing any depression recently.   Patient Active Problem List   Diagnosis Date Noted  . Seasonal affective disorder (HCC) 12/31/2019  . Iron deficiency anemia 05/16/2016  . Dysmenorrhea 12/08/2015  . Migraine without aura and responsive to treatment 12/08/2015  . Awareness of heartbeats 12/08/2015  . Vitamin D deficiency 12/08/2015  . Excessive and frequent menstruation 12/08/2015  . Palpitations 07/08/2014  . Paroxysmal supraventricular tachycardia (HCC) 07/08/2014     No current outpatient medications on file.   No Known Allergies   Past Surgical History:  Procedure Laterality Date  . iron fusion       Family History  Problem Relation Age of Onset  . Hypertension Father   . Hypothyroidism Mother      Social History   Tobacco Use  . Smoking status: Never Smoker  . Smokeless tobacco: Never Used  Substance Use Topics  . Alcohol use: Yes    Alcohol/week: 0.0 standard drinks    Comment: Rare     With staff assistance, above reviewed with the patient today.  ROS: As per HPI, otherwise no specific complaints on a limited and focused system review   No results found for this or any previous visit (from the past 72 hour(s)).   PHQ2/9: Depression screen [facility] 2/9 09/18/2020 03/04/2020 12/31/2019 01/11/2017 03/16/2016  Decreased Interest 3 2 1  0 0  Down, Depressed, Hopeless 0 0 0 0 0  PHQ - 2 Score 3 2 1  0 0  Altered sleeping 3 3 3  - -  Tired, decreased energy 3 3 3  - -  Change in appetite 1 3 3  - -  Feeling bad or failure about yourself  0 0 0 - -  Trouble concentrating 3 2 3  - -  Moving slowly or fidgety/restless 2 2 3  - -  Suicidal thoughts 0 0 0 - -  PHQ-9 Score 15 15 16  - -  Difficult doing work/chores Very difficult Very difficult Somewhat difficult - -   PHQ-2/9 Result is   Fall Risk: Fall Risk  09/18/2020 03/04/2020 12/31/2019  01/11/2017 03/16/2016  Falls in the past year? 0 0 0 No No  Number falls in past yr: 0 0 0 - -  Injury with Fall? 0 0 0 - -  Follow up - - Falls evaluation completed - -      Objective:   Vitals:   09/18/20 0756  BP: 100/70  Pulse: 92  Resp: 16  Temp: 98.3 F (36.8 C)  TempSrc: Oral  SpO2: 100%  Weight: 169 lb 3.2 oz (76.7 kg)  Height: 5\' 5"  (1.651 m)    Body mass index is 28.16  kg/m.  Physical Exam   NAD, masked, pleasant HEENT - [place]/AT, sclera anicteric, PERRL, EOMI, conj - non-inj'ed,  pharynx clear Neck - supple, no adenopathy, good range of motion without discomfort, no TM, carotids 2+ and = without bruits bilat Car - RRR without m/g/r Pulm- RR and effort normal at rest, CTA without wheeze or rales Abd - soft, NT diffusely, ND,  Back - no CVA tenderness Skin-tattoo noted on her right distal forearm Ext -  no active joints of the hand noted bilateral, had good wrist motion bilaterally with no swelling of either wrist, good grip bilaterally, distal pulses in the upper extremities good bilateral, good capillary refill bilateral, hands warm to the touch, no pains with shoulder or elbow motions.  Good shoulder shrug, Neuro/psychiatric - affect was not flat, appropriate with conversation  Alert and oriented  Grossly non-focal - good strength on testing upper extremities, sensation intact to LT in distal upper extremities  Speech normal   Results for orders placed or performed in visit on 03/04/20  CBC with Differential/Platelet  Result Value Ref Range   WBC 3.8 3.8 - 10.8 Thousand/uL   RBC 4.42 3.80 - 5.10 Million/uL   Hemoglobin 10.4 (L) 11.7 - 15.5 g/dL   HCT 40.934.2 (L) 35 - 45 %   MCV 77.4 (L) 80.0 - 100.0 fL   MCH 23.5 (L) 27.0 - 33.0 pg   MCHC 30.4 (L) 32.0 - 36.0 g/dL   RDW 81.114.6 91.411.0 - 78.215.0 %   Platelets 283 140 - 400 Thousand/uL   MPV 11.6 7.5 - 12.5 fL   Neutro Abs 1,786 1,500 - 7,800 cells/uL   Lymphs Abs 1,604 850 - 3,900 cells/uL   Absolute Monocytes 342 200  - 950 cells/uL   Eosinophils Absolute 49 15.0 - 500.0 cells/uL   Basophils Absolute 19 0.0 - 200.0 cells/uL   Neutrophils Relative % 47 %   Total Lymphocyte 42.2 %   Monocytes Relative 9.0 %   Eosinophils Relative 1.3 %   Basophils Relative 0.5 %  Iron, TIBC and Ferritin Panel  Result Value Ref Range   Iron 27 (L) 40 - 190 mcg/dL   TIBC 956390 213250 - 086450 mcg/dL (calc)   %SAT 7 (L) 16 - 45 % (calc)   Ferritin 3 (L) 16 - 154 ng/mL    Last labs reviewed   Assessment & Plan:   1. Numbness and tingling in both hands 2. Numbness and tingling in left arm Noted they numbness and tingling more in the distal part of the hands bilaterally, more left-sided recently, and concern for possible compression neuropathy.  Also the power lifting activities can contribute, in addition to wearing the wrist wraps for 1 to 2 hours during the workouts.  Recommended trying to lessen use of those wrist wraps without increasing risk of injury with the weight lifting.  Also trying to minimize any other compression with resting on the arm or shoulder area.  Discussed other potential metabolic sources, will check some laboratory tests today as well. Doubt a Raynaud's phenomenon issue.  - B12 - TSH - BASIC METABOLIC PANEL WITH GFR  3. Iron deficiency anemia due to chronic blood loss Concerned that she has not had any management after her last lab tests, where she was anemic with a significant iron deficiency.  She has had iron infusions in the past as well as having been on iron supplements, although notes is not good taking medicines and has not been taking iron supplements in the past.  Did feel rechecking labs today was appropriate, and discussed the likelihood of supplement recommendations again, and possibly seeing hematology again for infusions pending these results. She notes she does eat meat, although does not eat red meats often, does eat a lot of green leafy vegetables including spinach and kale and is not a  vegetarian. - CBC with Differential/Platelet - Iron, TIBC and Ferritin Panel  4. Dysmenorrhea 5. Menorrhagia with regular cycle Do feel her heavy periods are contributing to her anemia and iron deficiency, and seeing gynecology is a good next step to try to help with this.  She notes she has had some abdominal discomfort at times, possibly related to her heavier periods. We will again put a referral through, and await recheck of the labs presently.  - CBC with Differential/Platelet - Iron, TIBC and Ferritin Panel  6. Low serum vitamin B12 A history of this was noted, although the last check was normal. We will recheck a B12 level again today with her above symptoms. - B12  7. Seasonal affective disorder [facility]) She notes she is doing well presently, stated she is currently in" a good place "and also stating she has not had depression concerns in the recent past. Continue to monitor presently       Jamelle Haring, MD 09/18/20 8:16 AM

## 2020-09-18 ENCOUNTER — Encounter: Payer: Self-pay | Admitting: Internal Medicine

## 2020-09-18 ENCOUNTER — Ambulatory Visit (INDEPENDENT_AMBULATORY_CARE_PROVIDER_SITE_OTHER): Payer: 59 | Admitting: Internal Medicine

## 2020-09-18 ENCOUNTER — Other Ambulatory Visit: Payer: Self-pay

## 2020-09-18 VITALS — BP 100/70 | HR 92 | Temp 98.3°F | Resp 16 | Ht 65.0 in | Wt 169.2 lb

## 2020-09-18 DIAGNOSIS — R202 Paresthesia of skin: Secondary | ICD-10-CM

## 2020-09-18 DIAGNOSIS — R200 Anesthesia of skin: Secondary | ICD-10-CM | POA: Insufficient documentation

## 2020-09-18 DIAGNOSIS — D500 Iron deficiency anemia secondary to blood loss (chronic): Secondary | ICD-10-CM

## 2020-09-18 DIAGNOSIS — N946 Dysmenorrhea, unspecified: Secondary | ICD-10-CM

## 2020-09-18 DIAGNOSIS — N920 Excessive and frequent menstruation with regular cycle: Secondary | ICD-10-CM

## 2020-09-18 DIAGNOSIS — E538 Deficiency of other specified B group vitamins: Secondary | ICD-10-CM

## 2020-09-18 DIAGNOSIS — F338 Other recurrent depressive disorders: Secondary | ICD-10-CM

## 2020-09-19 ENCOUNTER — Other Ambulatory Visit: Payer: Self-pay | Admitting: Internal Medicine

## 2020-09-19 DIAGNOSIS — D500 Iron deficiency anemia secondary to blood loss (chronic): Secondary | ICD-10-CM

## 2020-09-19 LAB — CBC WITH DIFFERENTIAL/PLATELET
Absolute Monocytes: 262 cells/uL (ref 200–950)
Basophils Absolute: 19 cells/uL (ref 0–200)
Basophils Relative: 0.5 %
Eosinophils Absolute: 30 cells/uL (ref 15–500)
Eosinophils Relative: 0.8 %
HCT: 33.5 % — ABNORMAL LOW (ref 35.0–45.0)
Hemoglobin: 10.2 g/dL — ABNORMAL LOW (ref 11.7–15.5)
Lymphs Abs: 1474 cells/uL (ref 850–3900)
MCH: 23 pg — ABNORMAL LOW (ref 27.0–33.0)
MCHC: 30.4 g/dL — ABNORMAL LOW (ref 32.0–36.0)
MCV: 75.6 fL — ABNORMAL LOW (ref 80.0–100.0)
MPV: 11.5 fL (ref 7.5–12.5)
Monocytes Relative: 6.9 %
Neutro Abs: 2014 cells/uL (ref 1500–7800)
Neutrophils Relative %: 53 %
Platelets: 261 10*3/uL (ref 140–400)
RBC: 4.43 10*6/uL (ref 3.80–5.10)
RDW: 16.3 % — ABNORMAL HIGH (ref 11.0–15.0)
Total Lymphocyte: 38.8 %
WBC: 3.8 10*3/uL (ref 3.8–10.8)

## 2020-09-19 LAB — BASIC METABOLIC PANEL WITH GFR
BUN: 8 mg/dL (ref 7–25)
CO2: 28 mmol/L (ref 20–32)
Calcium: 9.6 mg/dL (ref 8.6–10.2)
Chloride: 105 mmol/L (ref 98–110)
Creat: 1.01 mg/dL (ref 0.50–1.10)
GFR, Est African American: 86 mL/min/{1.73_m2} (ref >=60)
GFR, Est Non African American: 74 mL/min/{1.73_m2} (ref >=60)
Glucose, Bld: 83 mg/dL (ref 65–99)
Potassium: 4.1 mmol/L (ref 3.5–5.3)
Sodium: 138 mmol/L (ref 135–146)

## 2020-09-19 LAB — VITAMIN B12: Vitamin B-12: 445 pg/mL (ref 200–1100)

## 2020-09-19 LAB — IRON,TIBC AND FERRITIN PANEL
%SAT: 7 % (calc) — ABNORMAL LOW (ref 16–45)
Ferritin: 4 ng/mL — ABNORMAL LOW (ref 16–154)
Iron: 29 ug/dL — ABNORMAL LOW (ref 40–190)
TIBC: 426 mcg/dL (calc) (ref 250–450)

## 2020-09-19 LAB — TSH: TSH: 0.91 mIU/L

## 2020-09-19 NOTE — Progress Notes (Signed)
Patient's labs reviewed. Continues with marked symptomatic iron def anemia and very low iron levels. Referred to Dr. Orlie Dakin for iron infusion consideration (had in past)/iron supplementation help and also to gyn for help with menorrhagia.  [facility]

## 2020-09-24 ENCOUNTER — Inpatient Hospital Stay: Payer: 59

## 2020-09-24 ENCOUNTER — Inpatient Hospital Stay: Payer: 59 | Admitting: Oncology

## 2020-09-30 ENCOUNTER — Encounter: Payer: Self-pay | Admitting: Obstetrics and Gynecology
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
## 2020-10-15 ENCOUNTER — Encounter: Payer: Self-pay | Admitting: Obstetrics and Gynecology
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
## 2020-10-22 ENCOUNTER — Telehealth: Payer: Self-pay | Admitting: Obstetrics and Gynecology

## 2020-10-22 NOTE — Telephone Encounter (Signed)
[facility] referring for menorrhagia and iron deficiency anemia concerns, needs evaluation. Called and left voicemail for patient to call back to be scheduled. Patient was scheduled 09/30/20 and 10/15/20 with ABC. Patient no showed both scheduled appointments

## 2020-10-27 NOTE — Telephone Encounter (Signed)
Called and left voicemail for patient to call back to be scheduled. 

## 2020-11-17 NOTE — Progress Notes (Signed)
Name: Bethany Marshall   MRN: [id number]    DOB: Sep 18, 1991   Date:11/18/2020       Progress Note  Subjective  Chief Complaint  Acute visit for lump in breast   HPI  Lump cyst she states a couple of months ago she noticed a bump on right upper outer breast that was itchy and red, only mildly tender, it resolved by itself but it returned a couple of days ago. She is concerned about breast cancer  Seasonal affective disorder: she is feeling down again , also lost 3 loved ones in the past 5 months, she always gets sad during winter months, but never took medications in the apst. She has lack of motivation, fatigue, feels sad, no suicidal thoughts. She is now willing to try medications. Spent time discussed physical activity, gratitude journal and light therapy  Iron deficiency: she is not taking supplementation, explained that it may be one of the causes of fatigue   Patient Active Problem List   Diagnosis Date Noted  . Numbness and tingling in both hands 09/18/2020  . Seasonal affective disorder (HCC) 12/31/2019  . Iron deficiency anemia 05/16/2016  . Dysmenorrhea 12/08/2015  . Migraine without aura and responsive to treatment 12/08/2015  . Awareness of heartbeats 12/08/2015  . Vitamin D deficiency 12/08/2015  . Excessive and frequent menstruation 12/08/2015  . Palpitations 07/08/2014  . Paroxysmal supraventricular tachycardia (HCC) 07/08/2014    Past Surgical History:  Procedure Laterality Date  . iron fusion      Family History  Problem Relation Age of Onset  . Hypertension Father   . Hypothyroidism Mother     Social History   Tobacco Use  . Smoking status: Never Smoker  . Smokeless tobacco: Never Used  Substance Use Topics  . Alcohol use: Yes    Alcohol/week: 0.0 standard drinks    Comment: Rare      Current Outpatient Medications:  .  amoxicillin-clavulanate (AUGMENTIN) 875-125 MG tablet, Take 1 tablet by mouth 2 (two) times daily., Disp: 14 tablet, Rfl: 0 .   DULoxetine (CYMBALTA) 30 MG capsule, Take 1-2 capsules (30-60 mg total) by mouth daily., Disp: 60 capsule, Rfl: 0  No Known Allergies  I personally reviewed active problem list, medication list, allergies, family history, social history, health maintenance with the patient/caregiver today.   ROS  Ten systems reviewed and is negative except as mentioned in HPI   Objective  Vitals:   11/18/20 1348  BP: 112/72  Pulse: 93  Resp: 18  Temp: 98.2 F (36.8 C)  TempSrc: Oral  SpO2: 96%  Weight: 171 lb (77.6 kg)  Height: 5\' 5"  (1.651 m)    Body mass index is 28.46 kg/m.  Physical Exam  Constitutional: Patient appears well-developed and well-nourished.  No distress.  HEENT: head atraumatic, normocephalic, pupils equal and reactive to light,  neck supple Cardiovascular: Normal rate, regular rhythm and normal heart sounds.  No murmur heard. No BLE edema. Pulmonary/Chest: Effort normal and breath sounds normal. No respiratory distress. Abdominal: Soft.  There is no tenderness. Breast: normal left breast exam, right breast small cyst like structure a little larger than a pea size, erythematous, rolls under skin at 11 o'clock 2 inches from right nipple. Slightly tender to touch Psychiatric: Patient has a normal mood and affect. behavior is normal. Judgment and thought content normal.  Recent Results (from the past 2160 hour(s))  CBC with Differential/Platelet     Status: Abnormal   Collection Time: 09/18/20  9:01 AM  Result  Value Ref Range   WBC 3.8 3.8 - 10.8 Thousand/uL   RBC 4.43 3.80 - 5.10 Million/uL   Hemoglobin 10.2 (L) 11.7 - 15.5 g/dL   HCT 25.3 (L) 66.4 - 40.3 %   MCV 75.6 (L) 80.0 - 100.0 fL   MCH 23.0 (L) 27.0 - 33.0 pg   MCHC 30.4 (L) 32.0 - 36.0 g/dL   RDW 47.4 (H) 25.9 - 56.3 %   Platelets 261 140 - 400 Thousand/uL   MPV 11.5 7.5 - 12.5 fL   Neutro Abs 2,014 1,500 - 7,800 cells/uL   Lymphs Abs 1,474 850 - 3,900 cells/uL   Absolute Monocytes 262 200 - 950 cells/uL    Eosinophils Absolute 30 15 - 500 cells/uL   Basophils Absolute 19 0 - 200 cells/uL   Neutrophils Relative % 53 %   Total Lymphocyte 38.8 %   Monocytes Relative 6.9 %   Eosinophils Relative 0.8 %   Basophils Relative 0.5 %  Iron, TIBC and Ferritin Panel     Status: Abnormal   Collection Time: 09/18/20  9:01 AM  Result Value Ref Range   Iron 29 (L) 40 - 190 mcg/dL   TIBC 875 643 - 329 mcg/dL (calc)   %SAT 7 (L) 16 - 45 % (calc)   Ferritin 4 (L) 16 - 154 ng/mL  B12     Status: None   Collection Time: 09/18/20  9:01 AM  Result Value Ref Range   Vitamin B-12 445 200 - 1,100 pg/mL  TSH     Status: None   Collection Time: 09/18/20  9:01 AM  Result Value Ref Range   TSH 0.91 mIU/L    Comment:           Reference Range .           > or = 20 Years  0.40-4.50 .                Pregnancy Ranges           First trimester    0.26-2.66           Second trimester   0.55-2.73           Third trimester    0.43-2.91   BASIC METABOLIC PANEL WITH GFR     Status: None   Collection Time: 09/18/20  9:01 AM  Result Value Ref Range   Glucose, Bld 83 65 - 99 mg/dL    Comment: .            Fasting reference interval .    BUN 8 7 - 25 mg/dL   Creat 5.18 8.41 - 6.60 mg/dL   GFR, Est Non African American 74 > OR = 60 mL/min/1.61m2   GFR, Est African American 86 > OR = 60 mL/min/1.51m2   BUN/Creatinine Ratio NOT APPLICABLE 6 - 22 (calc)   Sodium 138 135 - 146 mmol/L   Potassium 4.1 3.5 - 5.3 mmol/L   Chloride 105 98 - 110 mmol/L   CO2 28 20 - 32 mmol/L   Calcium 9.6 8.6 - 10.2 mg/dL      YTK1/6: Depression screen [facility] 2/9 11/18/2020 09/18/2020 03/04/2020 12/31/2019 01/11/2017  Decreased Interest 1 3 2 1  0  Down, Depressed, Hopeless 0 0 0 0 0  PHQ - 2 Score 1 3 2 1  0  Altered sleeping 3 3 3 3  -  Tired, decreased energy 3 3 3 3  -  Change in appetite 1 1 3 3  -  Feeling  bad or failure about yourself  2 0 0 0 -  Trouble concentrating 3 3 2 3  -  Moving slowly or fidgety/restless 0 2 2 3  -   Suicidal thoughts 0 0 0 0 -  PHQ-9 Score 13 15 15 16  -  Difficult doing work/chores Somewhat difficult Very difficult Very difficult Somewhat difficult -    phq 9 is positive   Fall Risk: Fall Risk  11/18/2020 09/18/2020 03/04/2020 12/31/2019 01/11/2017  Falls in the past year? 0 0 0 0 No  Number falls in past yr: 0 0 0 0 -  Injury with Fall? 0 0 0 0 -  Follow up Falls evaluation completed - - Falls evaluation completed -     Functional Status Survey: Is the patient deaf or have difficulty hearing?: No Does the patient have difficulty seeing, even when wearing glasses/contacts?: No Does the patient have difficulty concentrating, remembering, or making decisions?: Yes Does the patient have difficulty walking or climbing stairs?: No Does the patient have difficulty dressing or bathing?: No Does the patient have difficulty doing errands alone such as visiting a doctor's office or shopping?: No    Assessment & Plan  1. Breast infection in female  - amoxicillin-clavulanate (AUGMENTIN) 875-125 MG tablet; Take 1 tablet by mouth 2 (two) times daily.  Dispense: 14 tablet; Refill: 0  2. Seasonal affective disorder (HCC)  - DULoxetine (CYMBALTA) 30 MG capsule; Take 1-2 capsules (30-60 mg total) by mouth daily.  Dispense: 60 capsule; Refill: 0  3. Iron deficiency anemia due to chronic blood loss  She will reach out to hematologist and resume supplements

## 2020-11-18 ENCOUNTER — Other Ambulatory Visit: Payer: Self-pay

## 2020-11-18 ENCOUNTER — Encounter: Payer: Self-pay | Admitting: Family Medicine

## 2020-11-18 ENCOUNTER — Ambulatory Visit (INDEPENDENT_AMBULATORY_CARE_PROVIDER_SITE_OTHER): Payer: 59 | Admitting: Family Medicine

## 2020-11-18 VITALS — BP 112/72 | HR 93 | Temp 98.2°F | Resp 18 | Ht 65.0 in | Wt 171.0 lb

## 2020-11-18 DIAGNOSIS — D500 Iron deficiency anemia secondary to blood loss (chronic): Secondary | ICD-10-CM | POA: Diagnosis not present

## 2020-11-18 DIAGNOSIS — Z1231 Encounter for screening mammogram for malignant neoplasm of breast: Secondary | ICD-10-CM

## 2020-11-18 DIAGNOSIS — N61 Mastitis without abscess: Secondary | ICD-10-CM | POA: Diagnosis not present

## 2020-11-18 DIAGNOSIS — F338 Other recurrent depressive disorders: Secondary | ICD-10-CM | POA: Diagnosis not present

## 2020-11-18 MED ORDER — AMOXICILLIN-POT CLAVULANATE 875-125 MG PO TABS: 1.0000 | ORAL_TABLET | Freq: Two times a day (BID) | ORAL | 0 refills | Status: DC

## 2020-11-18 MED ORDER — DULOXETINE HCL 30 MG PO CPEP: 30.0000 mg | ORAL_CAPSULE | Freq: Every day | ORAL | 0 refills | Status: DC

## 2020-11-19 ENCOUNTER — Other Ambulatory Visit: Payer: Self-pay

## 2020-11-19 ENCOUNTER — Encounter: Payer: Self-pay | Admitting: Family Medicine

## 2020-11-19 DIAGNOSIS — F338 Other recurrent depressive disorders: Secondary | ICD-10-CM

## 2020-11-19 DIAGNOSIS — N61 Mastitis without abscess: Secondary | ICD-10-CM

## 2020-11-19 MED ORDER — AMOXICILLIN-POT CLAVULANATE 875-125 MG PO TABS: 1.0000 | ORAL_TABLET | Freq: Two times a day (BID) | ORAL | 0 refills | Status: DC

## 2020-11-19 MED ORDER — DULOXETINE HCL 30 MG PO CPEP: 30.0000 mg | ORAL_CAPSULE | Freq: Every day | ORAL | 0 refills | Status: DC

## 2020-12-09 ENCOUNTER — Other Ambulatory Visit: Payer: 59

## 2020-12-28 DIAGNOSIS — R21 Rash and other nonspecific skin eruption: Secondary | ICD-10-CM | POA: Insufficient documentation

## 2020-12-30 NOTE — Progress Notes (Unsigned)
Name: Bethany Marshall   MRN: [id number]    DOB: 1991/03/02   Date:01/01/2021       Progress Note  Subjective  Chief Complaint  Annual Exam and follow up  HPI  Patient presents for annual CPE and follow up  Lump cyst she states a few months ago, she was seen last month and was given antibiotics and symptoms or pain resolved, still a little spot in the area.   Seasonal affective disorder: she was seen here in January feeling down again, she has lost 3 loved ones in the past 5 months. She always gets sad during winter monthsShe has lack of motivation, fatigue, feels sad, no suicidal thoughts. She started on Duloxetine , initially had nausea, but that has subsided and mood is improving  Iron deficiency: she has a long history of iron deficiency anemia, she has heavy cycles that lasts 7 days, she is willing to start ortho evra patch. Under the care of Dr. Grayland Ormond and has infusions every so often.   Vitamin D and B12 deficiency: not currently taking supplements  Paroxysmal Ventricular Tachycardia: she has seen cardiologist, she is doing well now , no chest pain , sob or palpitation   Diet: she has an iron rich diet  Exercise: continue regular physical activity   [place] Office Visit from 11/18/2020 [facility]  AUDIT-C Score 0     Depression: Phq 9 is  positive Depression screen [facility] 2/9 01/01/2021 11/18/2020 09/18/2020 03/04/2020 12/31/2019  Decreased Interest _0 Down, Depressed, Hopeless 1 0 0 0 0  PHQ - 2 Score _1 Altered sleeping 0 _2 Tired, decreased energy _3 Change in appetite 0 _4 Feeling bad or failure about yourself  0 2 0 0 0  Trouble concentrating _5 Moving slowly or fidgety/restless 0 0 _6 Suicidal thoughts 0 0 0 0 0  PHQ-9 Score _7 Difficult doing work/chores - Somewhat difficult Very difficult Very difficult Somewhat difficult   Hypertension: BP Readings from Last 3 Encounters:   01/01/21 118/70  11/18/20 112/72  09/18/20 100/70   Obesity: Wt Readings from Last 3 Encounters:  01/01/21 171 lb (77.6 kg)  11/18/20 171 lb (77.6 kg)  09/18/20 169 lb 3.2 oz (76.7 kg)   BMI Readings from Last 3 Encounters:  01/01/21 28.46 kg/m  11/18/20 28.46 kg/m  09/18/20 28.16 kg/m     Vaccines:   HPV: up to at age 44 , ask insurance if age between 8-45 - she had one documented dose we will check database  Flu: educated and discussed with patient. She refused   Hep C Screening: 12/21/19 STD testing and prevention (HIV/chl/gon/syphilis): 12/21/19 Intimate partner violence: negative Sexual History : homosexual, does not share toys but would like to be checked  Menstrual History/LMP/Abnormal Bleeding: regular cycles, heavy , has iron deficiency anemia, she is wiling ortho evra patch to see if it will control cycles  Incontinence Symptoms: no problems   Breast cancer:  - BRCA gene screening: one aunt had breast cancer   Osteoporosis: Discussed high calcium and vitamin D supplementation, weight bearing exercises  Cervical cancer screening: 12/21/19  Skin cancer: Discussed monitoring for atypical lesions   ECG: 07/25/14  Advanced Care Planning: A voluntary discussion about advance care planning including the explanation and discussion of advance directives.  Discussed health care proxy and Living will, and the patient was able to identify a health care proxy as mother  Lipids: Lab Results  Component Value Date   CHOL 154 12/31/2019   CHOL 161 02/29/2016   CHOL 142 03/27/2014   Lab Results  Component Value Date   HDL 64 12/31/2019   HDL 61 02/29/2016   HDL 66 03/27/2014   Lab Results  Component Value Date   LDLCALC 77 12/31/2019   LDLCALC 88 02/29/2016   LDLCALC 64 03/27/2014   Lab Results  Component Value Date   TRIG 47 12/31/2019   TRIG 58 02/29/2016   TRIG 61 03/27/2014   Lab Results  Component Value Date   CHOLHDL 2.4 12/31/2019   CHOLHDL 2.6  02/29/2016   No results found for: LDLDIRECT  Glucose: Glucose, Bld  Date Value Ref Range Status  09/18/2020 83 65 - 99 mg/dL Final    Comment:    .            Fasting reference interval .   12/31/2019 78 65 - 99 mg/dL Final    Comment:    .            Fasting reference interval .   09/04/2019 100 (H) 70 - 99 mg/dL Final    Patient Active Problem List   Diagnosis Date Noted  . Pain in joint, lower leg 01/01/2021  . Stress fracture 01/01/2021  . Rash and nonspecific skin eruption 12/28/2020  . Numbness and tingling in both hands 09/18/2020  . Seasonal affective disorder ([place]) 12/31/2019  . Iron deficiency anemia 05/16/2016  . Dysmenorrhea 12/08/2015  . Migraine without aura and responsive to treatment 12/08/2015  . Awareness of heartbeats 12/08/2015  . Vitamin D deficiency 12/08/2015  . Excessive and frequent menstruation 12/08/2015  . Palpitations 07/08/2014  . Paroxysmal supraventricular tachycardia ([place]) 07/08/2014    Past Surgical History:  Procedure Laterality Date  . iron fusion      Family History  Problem Relation Age of Onset  . Hypertension Father   . Hypothyroidism Mother     Social History   Socioeconomic History  . Marital status: Single    Spouse name: Not on file  . Number of children: 0  . Years of education: Not on file  . Highest education level: Some college, no degree  Occupational History  . Occupation: security   Tobacco Use  . Smoking status: Never Smoker  . Smokeless tobacco: Never Used  Vaping Use  . Vaping Use: Never used  Substance and Sexual Activity  . Alcohol use: Yes    Alcohol/week: 0.0 standard drinks    Comment: Rare   . Drug use: No  . Sexual activity: Not Currently    Partners: Female    Birth control/protection: None    Comment: homosexual   Other Topics Concern  . Not on file  Social History Narrative  . Not on file   Social Determinants of Health   Financial Resource Strain: Low Risk   . Difficulty  of Paying Living Expenses: Not very hard  Food Insecurity: Food Insecurity Present  . Worried About Charity fundraiser in the Last Year: Sometimes true  . Ran Out of Food in the Last Year: Sometimes true  Transportation Needs: No Transportation Needs  . Lack of Transportation (Medical): No  . Lack of Transportation (Non-Medical): No  Physical Activity: Sufficiently Active  . Days of Exercise per Week: 6 days  . Minutes of Exercise per  Session: 70 min  Stress: Stress Concern Present  . Feeling of Stress : To some extent  Social Connections: Socially Isolated  . Frequency of Communication with Friends and Family: More than three times a week  . Frequency of Social Gatherings with Friends and Family: Twice a week  . Attends Religious Services: Never  . Active Member of Clubs or Organizations: No  . Attends Archivist Meetings: Never  . Marital Status: Never married  Intimate Partner Violence: Not At Risk  . Fear of Current or Ex-Partner: No  . Emotionally Abused: No  . Physically Abused: No  . Sexually Abused: No     Current Outpatient Medications:  .  norelgestromin-ethinyl estradiol (ORTHO EVRA) 150-35 MCG/24HR transdermal patch, Place 1 patch onto the skin once a week. For 3 weeks, skip one week and resume, Disp: 3 patch, Rfl: 12 .  DULoxetine (CYMBALTA) 60 MG capsule, Take 1 capsule (60 mg total) by mouth daily., Disp: 90 capsule, Rfl: 0  No Known Allergies   ROS  Constitutional: Negative for fever or weight change.  Respiratory: Negative for cough and shortness of breath.   Cardiovascular: Negative for chest pain or palpitations.  Gastrointestinal: Negative for abdominal pain, no bowel changes.  Musculoskeletal: Negative for gait problem or joint swelling.  Skin: Negative for rash.  Neurological: Negative for dizziness or headache.  No other specific complaints in a complete review of systems (except as listed in HPI above).  Objective  Vitals:   01/01/21  1529  BP: 118/70  Pulse: 89  Resp: 16  Temp: 97.7 F (36.5 C)  TempSrc: Oral  SpO2: 99%  Weight: 171 lb (77.6 kg)  Height: 5' 5" (1.651 m)    Body mass index is 28.46 kg/m.  Physical Exam  Constitutional: Patient appears well-developed and well-nourished. No distress.  HENT: Head: Normocephalic and atraumatic. Ears: B TMs ok, no erythema or effusion; Nose: Not done  Mouth/Throat:not done  Eyes: Conjunctivae and EOM are normal. Pupils are equal, round, and reactive to light. No scleral icterus.  Neck: Normal range of motion. Neck supple. No JVD present. No thyromegaly present.  Cardiovascular: Normal rate, regular rhythm and normal heart sounds.  No murmur heard. No BLE edema. Pulmonary/Chest: Effort normal and breath sounds normal. No respiratory distress. Abdominal: Soft. Bowel sounds are normal, no distension. There is no tenderness. no masses Breast: no lumps or masses, no nipple discharge or rashes, she has a small pimple like area below right breast  FEMALE GENITALIA:   Not done  Musculoskeletal: Normal range of motion, no joint effusions. No gross deformities Neurological: he is alert and oriented to person, place, and time. No cranial nerve deficit. Coordination, balance, strength, speech and gait are normal.  Skin: Skin is warm and dry. No rash noted. No erythema.  Psychiatric: Patient has a normal mood and affect. behavior is normal. Judgment and thought content normal.  Fall Risk: Fall Risk  01/01/2021 11/18/2020 09/18/2020 03/04/2020 12/31/2019  Falls in the past year? 0 0 0 0 0  Number falls in past yr: 0 0 0 0 0  Injury with Fall? 0 0 0 0 0  Follow up - Falls evaluation completed - - Falls evaluation completed     Functional Status Survey: Is the patient deaf or have difficulty hearing?: No Does the patient have difficulty seeing, even when wearing glasses/contacts?: Yes Does the patient have difficulty concentrating, remembering, or making decisions?: Yes Does the  patient have difficulty walking or climbing stairs?: No  Does the patient have difficulty dressing or bathing?: No Does the patient have difficulty doing errands alone such as visiting a doctor's office or shopping?: No   Assessment & Plan  1. Well adult exam  - Lipid panel - COMPLETE METABOLIC PANEL WITH GFR - HIV Antibody (routine testing w rflx) - RPR - Vitamin B12 - VITAMIN D 25 Hydroxy (Vit-D Deficiency, Fractures) - Hemoglobin A1c - norelgestromin-ethinyl estradiol (ORTHO EVRA) 150-35 MCG/24HR transdermal patch; Place 1 patch onto the skin once a week. For 3 weeks, skip one week and resume  Dispense: 3 patch; Refill: 12  2. Seasonal affective disorder ([place])  - DULoxetine (CYMBALTA) 60 MG capsule; Take 1 capsule (60 mg total) by mouth daily.  Dispense: 90 capsule; Refill: 0  3. Iron deficiency anemia due to chronic blood loss   4. Low serum vitamin B12  - Vitamin B12  5. Vitamin D deficiency  - VITAMIN D 25 Hydroxy (Vit-D Deficiency, Fractures)  6. Lipid screening  - Lipid panel  7. Diabetes mellitus screening   8. Paroxysmal supraventricular tachycardia ([place])  Doing well   9. Menorrhagia with regular cycle  - norelgestromin-ethinyl estradiol (ORTHO EVRA) 150-35 MCG/24HR transdermal patch; Place 1 patch onto the skin once a week. For 3 weeks, skip one week and resume  Dispense: 3 patch; Refill: 12  10. Long-term use of high-risk medication  - COMPLETE METABOLIC PANEL WITH GFR  11. Routine screening for STI (sexually transmitted infection)  - Cervicovaginal ancillary only  -USPSTF grade A and B recommendations reviewed with patient; age-appropriate recommendations, preventive care, screening tests, etc discussed and encouraged; healthy living encouraged; see AVS for patient education given to patient -Discussed importance of 150 minutes of physical activity weekly, eat two servings of fish weekly, eat one serving of tree nuts ( cashews, pistachios, pecans,  almonds.Marland Kitchen) every other day, eat 6 servings of fruit/vegetables daily and drink plenty of water and avoid sweet beverages.

## 2021-01-01 ENCOUNTER — Ambulatory Visit (INDEPENDENT_AMBULATORY_CARE_PROVIDER_SITE_OTHER): Payer: 59 | Admitting: Family Medicine

## 2021-01-01 ENCOUNTER — Other Ambulatory Visit: Payer: Self-pay

## 2021-01-01 ENCOUNTER — Other Ambulatory Visit (HOSPITAL_COMMUNITY)
Admission: RE | Admit: 2021-01-01 | Discharge: 2021-01-01 | Disposition: A | Discharge status: Home / Self Care | Payer: 59 | Source: Ambulatory Visit | Attending: Family Medicine | Admitting: Family Medicine

## 2021-01-01 ENCOUNTER — Encounter: Payer: Self-pay | Admitting: Family Medicine

## 2021-01-01 VITALS — BP 118/70 | HR 89 | Temp 97.7°F | Resp 16 | Ht 65.0 in | Wt 171.0 lb

## 2021-01-01 DIAGNOSIS — Z113 Encounter for screening for infections with a predominantly sexual mode of transmission: Secondary | ICD-10-CM | POA: Diagnosis present

## 2021-01-01 DIAGNOSIS — D500 Iron deficiency anemia secondary to blood loss (chronic): Secondary | ICD-10-CM

## 2021-01-01 DIAGNOSIS — Z13220 Encounter for screening for lipoid disorders: Secondary | ICD-10-CM

## 2021-01-01 DIAGNOSIS — M8430XA Stress fracture, unspecified site, initial encounter for fracture: Secondary | ICD-10-CM | POA: Insufficient documentation

## 2021-01-01 DIAGNOSIS — I471 Supraventricular tachycardia: Secondary | ICD-10-CM

## 2021-01-01 DIAGNOSIS — Z0000 Encounter for general adult medical examination without abnormal findings: Secondary | ICD-10-CM

## 2021-01-01 DIAGNOSIS — E538 Deficiency of other specified B group vitamins: Secondary | ICD-10-CM

## 2021-01-01 DIAGNOSIS — F338 Other recurrent depressive disorders: Secondary | ICD-10-CM | POA: Diagnosis not present

## 2021-01-01 DIAGNOSIS — Z131 Encounter for screening for diabetes mellitus: Secondary | ICD-10-CM

## 2021-01-01 DIAGNOSIS — N920 Excessive and frequent menstruation with regular cycle: Secondary | ICD-10-CM

## 2021-01-01 DIAGNOSIS — E559 Vitamin D deficiency, unspecified: Secondary | ICD-10-CM

## 2021-01-01 DIAGNOSIS — Z79899 Other long term (current) drug therapy: Secondary | ICD-10-CM

## 2021-01-01 DIAGNOSIS — M25569 Pain in unspecified knee: Secondary | ICD-10-CM | POA: Insufficient documentation

## 2021-01-01 MED ORDER — NORELGESTROMIN-ETH ESTRADIOL 150-35 MCG/24HR TD PTWK: 1.0000 | MEDICATED_PATCH | TRANSDERMAL | 12 refills | Status: DC

## 2021-01-01 MED ORDER — DULOXETINE HCL 60 MG PO CPEP: 60.0000 mg | ORAL_CAPSULE | Freq: Every day | ORAL | 0 refills | Status: DC

## 2021-01-01 NOTE — Patient Instructions (Signed)
Preventive Care 3-32 Years Old, Female Preventive care refers to lifestyle choices and visits with your health care provider that can promote health and wellness. This includes:  A yearly physical exam. This is also called an annual wellness visit.  Regular dental and eye exams.  Immunizations.  Screening for certain conditions.  Healthy lifestyle choices, such as: ? Eating a healthy diet. ? Getting regular exercise. ? Not using drugs or products that contain nicotine and tobacco. ? Limiting alcohol use. What can I expect for my preventive care visit? Physical exam Your health care provider may check your:  Height and weight. These may be used to calculate your BMI (body mass index). BMI is a measurement that tells if you are at a healthy weight.  Heart rate and blood pressure.  Body temperature.  Skin for abnormal spots. Counseling Your health care provider may ask you questions about your:  Past medical problems.  Family's medical history.  Alcohol, tobacco, and drug use.  Emotional well-being.  Home life and relationship well-being.  Sexual activity.  Diet, exercise, and sleep habits.  Work and work Statistician.  Access to firearms.  Method of birth control.  Menstrual cycle.  Pregnancy history. What immunizations do I need? Vaccines are usually given at various ages, according to a schedule. Your health care provider will recommend vaccines for you based on your age, medical history, and lifestyle or other factors, such as travel or where you work.   What tests do I need? Blood tests  Lipid and cholesterol levels. These may be checked every 5 years starting at age 62.  Hepatitis C test.  Hepatitis B test. Screening  Diabetes screening. This is done by checking your blood sugar (glucose) after you have not eaten for a while (fasting).  STD (sexually transmitted disease) testing, if you are at risk.  BRCA-related cancer screening. This may be  done if you have a family history of breast, ovarian, tubal, or peritoneal cancers.  Pelvic exam and Pap test. This may be done every 3 years starting at age 69. Starting at age 17, this may be done every 5 years if you have a Pap test in combination with an HPV test. Talk with your health care provider about your test results, treatment options, and if necessary, the need for more tests.   Follow these instructions at home: Eating and drinking  Eat a healthy diet that includes fresh fruits and vegetables, whole grains, lean protein, and low-fat dairy products.  Take vitamin and mineral supplements as recommended by your health care provider.  Do not drink alcohol if: ? Your health care provider tells you not to drink. ? You are pregnant, may be pregnant, or are planning to become pregnant.  If you drink alcohol: ? Limit how much you have to 0-1 drink a day. ? Be aware of how much alcohol is in your drink. In the [place] one drink equals one 12 oz bottle of beer (355 mL), one 5 oz glass of wine (148 mL), or one 1 oz glass of hard liquor (44 mL).   Lifestyle  Take daily care of your teeth and gums. Brush your teeth every morning and night with fluoride toothpaste. Floss one time each day.  Stay active. Exercise for at least 30 minutes 5 or more days each week.  Do not use any products that contain nicotine or tobacco, such as cigarettes, e-cigarettes, and chewing tobacco. If you need help quitting, ask your health care provider.  Do not  use drugs.  If you are sexually active, practice safe sex. Use a condom or other form of protection to prevent STIs (sexually transmitted infections).  If you do not wish to become pregnant, use a form of birth control. If you plan to become pregnant, see your health care provider for a prepregnancy visit.  Find healthy ways to cope with stress, such as: ? Meditation, yoga, or listening to music. ? Journaling. ? Talking to a trusted  person. ? Spending time with friends and family. Safety  Always wear your seat belt while driving or riding in a vehicle.  Do not drive: ? If you have been drinking alcohol. Do not ride with someone who has been drinking. ? When you are tired or distracted. ? While texting.  Wear a helmet and other protective equipment during sports activities.  If you have firearms in your house, make sure you follow all gun safety procedures.  Seek help if you have been physically or sexually abused. What's next?  Go to your health care provider once a year for an annual wellness visit.  Ask your health care provider how often you should have your eyes and teeth checked.  Stay up to date on all vaccines. This information is not intended to replace advice given to you by your health care provider. Make sure you discuss any questions you have with your health care provider. Document Revised: 06/28/2020 Document Reviewed: 07/12/2018 Elsevier Patient Education  2021 [employer].

## 2021-01-04 ENCOUNTER — Other Ambulatory Visit: Payer: Self-pay | Admitting: Family Medicine

## 2021-01-04 LAB — LIPID PANEL
Cholesterol: 161 mg/dL (ref <200)
HDL: 79 mg/dL (ref >=50)
LDL Cholesterol (Calc): 72 mg/dL (calc)
Non-HDL Cholesterol (Calc): 82 mg/dL (calc) (ref <130)
Total CHOL/HDL Ratio: 2 (calc) (ref <5.0)
Triglycerides: 35 mg/dL (ref <150)

## 2021-01-04 LAB — HEMOGLOBIN A1C
Hgb A1c MFr Bld: 5.2 % of total Hgb (ref <5.7)
Mean Plasma Glucose: 103 mg/dL
eAG (mmol/L): 5.7 mmol/L

## 2021-01-04 LAB — COMPLETE METABOLIC PANEL WITH GFR
AG Ratio: 1.5 (calc) (ref 1.0–2.5)
ALT: 16 U/L (ref 6–29)
AST: 29 U/L (ref 10–30)
Albumin: 4.2 g/dL (ref 3.6–5.1)
Alkaline phosphatase (APISO): 46 U/L (ref 31–125)
BUN: 7 mg/dL (ref 7–25)
CO2: 26 mmol/L (ref 20–32)
Calcium: 10.2 mg/dL (ref 8.6–10.2)
Chloride: 104 mmol/L (ref 98–110)
Creat: 0.97 mg/dL (ref 0.50–1.10)
GFR, Est African American: 90 mL/min/{1.73_m2} (ref >=60)
GFR, Est Non African American: 78 mL/min/{1.73_m2} (ref >=60)
Globulin: 2.8 g/dL (calc) (ref 1.9–3.7)
Glucose, Bld: 90 mg/dL (ref 65–99)
Potassium: 3.9 mmol/L (ref 3.5–5.3)
Sodium: 138 mmol/L (ref 135–146)
Total Bilirubin: 0.6 mg/dL (ref 0.2–1.2)
Total Protein: 7 g/dL (ref 6.1–8.1)

## 2021-01-04 LAB — RPR: RPR Ser Ql: NONREACTIVE

## 2021-01-04 LAB — VITAMIN D 25 HYDROXY (VIT D DEFICIENCY, FRACTURES): Vit D, 25-Hydroxy: 19 ng/mL — ABNORMAL LOW (ref 30–100)

## 2021-01-04 LAB — VITAMIN B12: Vitamin B-12: 471 pg/mL (ref 200–1100)

## 2021-01-04 LAB — HIV ANTIBODY (ROUTINE TESTING W REFLEX): HIV 1&2 Ab, 4th Generation: NONREACTIVE

## 2021-01-04 MED ORDER — VITAMIN D (ERGOCALCIFEROL) 1.25 MG (50000 UNIT) PO CAPS: 50000.0000 [IU] | ORAL_CAPSULE | ORAL | 0 refills | Status: DC

## 2021-01-05 LAB — CERVICOVAGINAL ANCILLARY ONLY
Chlamydia: NEGATIVE
Comment: NEGATIVE
Comment: NORMAL
Neisseria Gonorrhea: NEGATIVE

## 2021-02-23 ENCOUNTER — Encounter: Payer: Self-pay | Admitting: Family Medicine

## 2021-02-23 ENCOUNTER — Ambulatory Visit (INDEPENDENT_AMBULATORY_CARE_PROVIDER_SITE_OTHER): Payer: 59 | Admitting: Family Medicine

## 2021-02-23 ENCOUNTER — Other Ambulatory Visit: Payer: Self-pay

## 2021-02-23 VITALS — BP 126/72 | HR 98 | Temp 98.1°F | Resp 16 | Ht 65.0 in | Wt 180.0 lb

## 2021-02-23 DIAGNOSIS — H6123 Impacted cerumen, bilateral: Secondary | ICD-10-CM

## 2021-02-23 DIAGNOSIS — M722 Plantar fascial fibromatosis: Secondary | ICD-10-CM

## 2021-02-23 MED ORDER — MELOXICAM 15 MG PO TABS: 15.0000 mg | ORAL_TABLET | Freq: Every day | ORAL | 0 refills | Status: DC

## 2021-02-23 NOTE — Patient Instructions (Signed)
Plantar Fasciitis Rehab Ask your health care provider which exercises are safe for you. Do exercises exactly as told by your health care provider and adjust them as directed. It is normal to feel mild stretching, pulling, tightness, or discomfort as you do these exercises. Stop right away if you feel sudden pain or your pain gets worse. Do not begin these exercises until told by your health care provider. Stretching and range-of-motion exercises These exercises warm up your muscles and joints and improve the movement and flexibility of your foot. These exercises also help to relieve pain. Plantar fascia stretch 1. Sit with your left / right leg crossed over your opposite knee. 2. Hold your heel with one hand with that thumb near your arch. With your other hand, hold your toes and gently pull them back toward the top of your foot. You should feel a stretch on the base (bottom) of your toes, or the bottom of your foot (plantar fascia), or both. 3. Hold this stretch for__________ seconds. 4. Slowly release your toes and return to the starting position. Repeat __________ times. Complete this exercise __________ times a day.   Gastrocnemius stretch, standing This exercise is also called a calf (gastroc) stretch. It stretches the muscles in the back of the upper calf. 1. Stand with your hands against a wall. 2. Extend your left / right leg behind you, and bend your front knee slightly. 3. Keeping your heels on the floor, your toes facing forward, and your back knee straight, shift your weight toward the wall. Do not arch your back. You should feel a gentle stretch in your upper calf. 4. Hold this position for __________ seconds. Repeat __________ times. Complete this exercise __________ times a day.   Soleus stretch, standing This exercise is also called a calf (soleus) stretch. It stretches the muscles in the back of the lower calf. 1. Stand with your hands against a wall. 2. Extend your left / right  leg behind you, and bend your front knee slightly. 3. Keeping your heels on the floor and your toes facing forward, bend your back knee and shift your weight slightly over your back leg. You should feel a gentle stretch deep in your lower calf. 4. Hold this position for __________ seconds. Repeat __________ times. Complete this exercise __________ times a day. Gastroc and soleus stretch, standing step This exercise stretches the muscles in the back of the lower leg. These muscles are in the upper calf (gastrocnemius) and the lower calf (soleus). 1. Stand with the ball of your left / right foot on the front of a step. The ball of your foot is on the walking surface, right under your toes. 2. Keep your other foot firmly on the same step. 3. Hold on to the wall or a railing for balance. 4. Slowly lift your other foot, allowing your body weight to press your heel down over the edge of the front of the step. Keep knee straight and unbent. You should feel a stretch in your calf. 5. Hold this position for __________ seconds. 6. Return both feet to the step. 7. Repeat this exercise with a slight bend in your left / right knee. Repeat __________ times with your left / right knee straight and __________ times with your left / right knee bent. Complete this exercise __________ times a day. Balance exercise This exercise builds your balance and strength control of your arch to help take pressure off your plantar fascia. Single leg stand If this exercise   is too easy, you can try it with your eyes closed or while standing on a pillow. 1. Without shoes, stand near a railing or in a doorway. You may hold on to the railing or door frame as needed. 2. Stand on your left / right foot. Keep your big toe down on the floor and lift the arch of your foot. You should feel a stretch across the bottom of your foot and your arch. Do not let your foot roll inward. 3. Hold this position for __________ seconds. Repeat  __________ times. Complete this exercise __________ times a day. This information is not intended to replace advice given to you by your health care provider. Make sure you discuss any questions you have with your health care provider. Document Revised: 08/13/2020 Document Reviewed: 08/13/2020 Elsevier Patient Education  2021 [employer]. Plantar Fasciitis  Plantar fasciitis is a painful foot condition that affects the heel. It occurs when the band of tissue that connects the toes to the heel bone (plantar fascia) becomes irritated. This can happen as the result of exercising too much or doing other repetitive activities (overuse injury). Plantar fasciitis can cause mild irritation to severe pain that makes it difficult to walk or move. The pain is usually worse in the morning after sleeping, or after sitting or lying down for a period of time. Pain may also be worse after long periods of walking or standing. What are the causes? This condition may be caused by:  Standing for long periods of time.  Wearing shoes that do not have good arch support.  Doing activities that put stress on joints (high-impact activities). This includes ballet and exercise that makes your heart beat faster (aerobic exercise), such as running.  Being overweight.  An abnormal way of walking (gait).  Tight muscles in the back of your lower leg (calf).  High arches in your feet or flat feet.  Starting a new athletic activity. What are the signs or symptoms? The main symptom of this condition is heel pain. Pain may get worse after the following:  Taking the first steps after a time of rest, especially in the morning after awakening, or after you have been sitting or lying down for a while.  Long periods of standing still. Pain may decrease after 30-45 minutes of activity, such as gentle walking. How is this diagnosed? This condition may be diagnosed based on your medical history, a physical exam, and your  symptoms. Your health care provider will check for:  A tender area on the bottom of your foot.  A high arch in your foot or flat feet.  Pain when you move your foot.  Difficulty moving your foot. You may have imaging tests to confirm the diagnosis, such as:  X-rays.  Ultrasound.  MRI. How is this treated? Treatment for plantar fasciitis depends on how severe your condition is. Treatment may include:  Rest, ice, pressure (compression), and raising (elevating) the affected foot. This is called RICE therapy. Your health care provider may recommend RICE therapy along with over-the-counter pain medicines to manage your pain.  Exercises to stretch your calves and your plantar fascia.  A splint that holds your foot in a stretched, upward position while you sleep (night splint).  Physical therapy to relieve symptoms and prevent problems in the future.  Injections of steroid medicine (cortisone) to relieve pain and inflammation.  Stimulating your plantar fascia with electrical impulses (extracorporeal shock wave therapy). This is usually the last treatment option before surgery.    Surgery, if other treatments have not worked after 12 months. Follow these instructions at home: Managing pain, stiffness, and swelling  If directed, put ice on the painful area. To do this: ? Put ice in a plastic bag, or use a frozen bottle of water. ? Place a towel between your skin and the bag or bottle. ? Roll the bottom of your foot over the bag or bottle. ? Do this for 20 minutes, 2-3 times a day.  Wear athletic shoes that have air-sole or gel-sole cushions, or try soft shoe inserts that are designed for plantar fasciitis.  Elevate your foot above the level of your heart while you are sitting or lying down.   Activity  Avoid activities that cause pain. Ask your health care provider what activities are safe for you.  Do physical therapy exercises and stretches as told by your health care  provider.  Try activities and forms of exercise that are easier on your joints (low impact). Examples include swimming, water aerobics, and biking. General instructions  Take over-the-counter and prescription medicines only as told by your health care provider.  Wear a night splint while sleeping, if told by your health care provider. Loosen the splint if your toes tingle, become numb, or turn cold and blue.  Maintain a healthy weight, or work with your health care provider to lose weight as needed.  Keep all follow-up visits. This is important. Contact a health care provider if you have:  Symptoms that do not go away with home treatment.  Pain that gets worse.  Pain that affects your ability to move or do daily activities. Summary  Plantar fasciitis is a painful foot condition that affects the heel. It occurs when the band of tissue that connects the toes to the heel bone (plantar fascia) becomes irritated.  Heel pain is the main symptom of this condition. It may get worse after exercising too much or standing still for a long time.  Treatment varies, but it usually starts with rest, ice, pressure (compression), and raising (elevating) the affected foot. This is called RICE therapy. Over-the-counter medicines can also be used to manage pain. This information is not intended to replace advice given to you by your health care provider. Make sure you discuss any questions you have with your health care provider. Document Revised: 02/17/2020 Document Reviewed: 02/17/2020 Elsevier Patient Education  2021 Elsevier Inc.  

## 2021-02-23 NOTE — Progress Notes (Signed)
Name: Bethany Marshall   MRN: [id number]    DOB: Jun 11, 1991   Date:02/23/2021       Progress Note  Subjective  Chief Complaint  Ear Check  HPI  Ear fullness: patient came in for difficulty hearing from right ear, she was found to have bilateral cerumen impaction and gave [place] verbal consent to do an ear lavage, after procedure discomfort and fullness resolved  Left heel pain: going on for months but getting progressively worse, worse when she first gets up in am, and after she has been sitting for a prolonged period of time, also bad at the end of the day. She has to wear steel boots for work   Patient Active Problem List   Diagnosis Date Noted  . Pain in joint, lower leg 01/01/2021  . Stress fracture 01/01/2021  . Rash and nonspecific skin eruption 12/28/2020  . Numbness and tingling in both hands 09/18/2020  . Seasonal affective disorder (HCC) 12/31/2019  . Iron deficiency anemia 05/16/2016  . Dysmenorrhea 12/08/2015  . Migraine without aura and responsive to treatment 12/08/2015  . Awareness of heartbeats 12/08/2015  . Vitamin D deficiency 12/08/2015  . Excessive and frequent menstruation 12/08/2015  . Palpitations 07/08/2014  . Paroxysmal supraventricular tachycardia (HCC) 07/08/2014    Past Surgical History:  Procedure Laterality Date  . iron fusion      Family History  Problem Relation Age of Onset  . Hypertension Father   . Hypothyroidism Mother     Social History   Tobacco Use  . Smoking status: Never Smoker  . Smokeless tobacco: Never Used  Substance Use Topics  . Alcohol use: Yes    Alcohol/week: 0.0 standard drinks    Comment: Rare      Current Outpatient Medications:  .  DULoxetine (CYMBALTA) 60 MG capsule, Take 1 capsule (60 mg total) by mouth daily., Disp: 90 capsule, Rfl: 0 .  norelgestromin-ethinyl estradiol (ORTHO EVRA) 150-35 MCG/24HR transdermal patch, Place 1 patch onto the skin once a week. For 3 weeks, skip one week and resume, Disp: 3 patch,  Rfl: 12 .  Vitamin D, Ergocalciferol, (DRISDOL) 1.25 MG (50000 UNIT) CAPS capsule, Take 1 capsule (50,000 Units total) by mouth every 7 (seven) days., Disp: 12 capsule, Rfl: 0 .  meloxicam (MOBIC) 15 MG tablet, Take 1 tablet (15 mg total) by mouth daily., Disp: 30 tablet, Rfl: 0  No Known Allergies  I personally reviewed active problem list, medication list, allergies, family history, social history with the patient/caregiver today.   ROS  Ten systems reviewed and is negative except as mentioned in HPI  Objective  Vitals:   02/23/21 1535  BP: 126/72  Pulse: 98  Resp: 16  Temp: 98.1 F (36.7 C)  TempSrc: Oral  SpO2: 99%  Weight: 180 lb (81.6 kg)  Height: 5\' 5"  (1.651 m)    Body mass index is 29.95 kg/m.  Physical Exam  Constitutional: Patient appears well-developed and well-nourished.  No distress.  HEENT: head atraumatic, normocephalic, pupils equal and reactive to light, ears bilateral cerumen impaction, neck supple Cardiovascular: Normal rate, regular rhythm and normal heart sounds.  No murmur heard. No BLE edema. Pulmonary/Chest: Effort normal and breath sounds normal. No respiratory distress. Abdominal: Soft.  There is no tenderness. Muscular skeletal: pain during palpation of left heel, normal rom of foot, flat feet  Psychiatric: Patient has a normal mood and affect. behavior is normal. Judgment and thought content normal.  Recent Results (from the past 2160 hour(s))  Cervicovaginal ancillary only  Status: None   Collection Time: 01/01/21  3:49 PM  Result Value Ref Range   Neisseria Gonorrhea Negative    Chlamydia Negative    Comment Normal Reference Ranger Chlamydia - Negative    Comment      Normal Reference Range Neisseria Gonorrhea - Negative  Lipid panel     Status: None   Collection Time: 01/01/21  4:07 PM  Result Value Ref Range   Cholesterol 161 <200 mg/dL   HDL 79 > OR = 50 mg/dL   Triglycerides 35 <161<150 mg/dL   LDL Cholesterol (Calc) 72 mg/dL  (calc)    Comment: Reference range: <100 . Desirable range <100 mg/dL for primary prevention;   <70 mg/dL for patients with CHD or diabetic patients  with > or = 2 CHD risk factors. Marland Kitchen. LDL-C is now calculated using the Martin-Hopkins  calculation, which is a validated novel method providing  better accuracy than the Friedewald equation in the  estimation of LDL-C.  Horald PollenMartin SS et al. Lenox AhrJAMA. 0960;454(092013;310(19): 2061-2068  (http://education.[facility].com/faq/FAQ164)    Total CHOL/HDL Ratio 2.0 <5.0 (calc)   Non-HDL Cholesterol (Calc) 82 <811<130 mg/dL (calc)    Comment: For patients with diabetes plus 1 major ASCVD risk  factor, treating to a non-HDL-C goal of <100 mg/dL  (LDL-C of <91<70 mg/dL) is considered a therapeutic  option.   COMPLETE METABOLIC PANEL WITH GFR     Status: None   Collection Time: 01/01/21  4:07 PM  Result Value Ref Range   Glucose, Bld 90 65 - 99 mg/dL    Comment: .            Fasting reference interval .    BUN 7 7 - 25 mg/dL   Creat 4.780.97 2.950.50 - 6.211.10 mg/dL   GFR, Est Non African American 78 > OR = 60 mL/min/1.3173m2   GFR, Est African American 90 > OR = 60 mL/min/1.773m2   BUN/Creatinine Ratio NOT APPLICABLE 6 - 22 (calc)   Sodium 138 135 - 146 mmol/L   Potassium 3.9 3.5 - 5.3 mmol/L   Chloride 104 98 - 110 mmol/L   CO2 26 20 - 32 mmol/L   Calcium 10.2 8.6 - 10.2 mg/dL   Total Protein 7.0 6.1 - 8.1 g/dL   Albumin 4.2 3.6 - 5.1 g/dL   Globulin 2.8 1.9 - 3.7 g/dL (calc)   AG Ratio 1.5 1.0 - 2.5 (calc)   Total Bilirubin 0.6 0.2 - 1.2 mg/dL   Alkaline phosphatase (APISO) 46 31 - 125 U/L   AST 29 10 - 30 U/L   ALT 16 6 - 29 U/L  HIV Antibody (routine testing w rflx)     Status: None   Collection Time: 01/01/21  4:07 PM  Result Value Ref Range   HIV 1&2 Ab, 4th Generation NON-REACTIVE NON-REACTI    Comment: HIV-1 antigen and HIV-1/HIV-2 antibodies were not detected. There is no laboratory evidence of HIV infection. Marland Kitchen. PLEASE NOTE: This information has been  disclosed to you from records whose confidentiality may be protected by state law.  If your state requires such protection, then the state law prohibits you from making any further disclosure of the information without the specific written consent of the person to whom it pertains, or as otherwise permitted by law. A general authorization for the release of medical or other information is NOT sufficient for this purpose. . For additional information please refer to http://education.[facility].com/faq/FAQ106 (This link is being provided for informational/ educational purposes only.) . Marland Kitchen. The  performance of this assay has not been clinically validated in patients less than 75 years old. .   RPR     Status: None   Collection Time: 01/01/21  4:07 PM  Result Value Ref Range   RPR Ser Ql NON-REACTIVE NON-REACTI  Vitamin B12     Status: None   Collection Time: 01/01/21  4:07 PM  Result Value Ref Range   Vitamin B-12 471 200 - 1,100 pg/mL  VITAMIN D 25 Hydroxy (Vit-D Deficiency, Fractures)     Status: Abnormal   Collection Time: 01/01/21  4:07 PM  Result Value Ref Range   Vit D, 25-Hydroxy 19 (L) 30 - 100 ng/mL    Comment: Vitamin D Status         25-OH Vitamin D: . Deficiency:                    <20 ng/mL Insufficiency:             20 - 29 ng/mL Optimal:                 > or = 30 ng/mL . For 25-OH Vitamin D testing on patients on  D2-supplementation and patients for whom quantitation  of D2 and D3 fractions is required, the [facility](TM) 25-OH VIT D, (D2,D3), LC/MS/MS is recommended: order  code 86578 (patients >67yrs). See Note 1 . Note 1 . For additional information, please refer to  http://education.[facility].com/faq/FAQ199  (This link is being provided for informational/ educational purposes only.)   Hemoglobin A1c     Status: None   Collection Time: 01/01/21  4:07 PM  Result Value Ref Range   Hgb A1c MFr Bld 5.2 <5.7 % of total Hgb    Comment: For  the purpose of screening for the presence of diabetes: . <5.7%       Consistent with the absence of diabetes 5.7-6.4%    Consistent with increased risk for diabetes             (prediabetes) > or =6.5%  Consistent with diabetes . This assay result is consistent with a decreased risk of diabetes. . Currently, no consensus exists regarding use of hemoglobin A1c for diagnosis of diabetes in children. . According to American Diabetes Association (ADA) guidelines, hemoglobin A1c <7.0% represents optimal control in non-pregnant diabetic patients. Different metrics may apply to specific patient populations.  Standards of Medical Care in Diabetes(ADA). .    Mean Plasma Glucose 103 mg/dL   eAG (mmol/L) 5.7 mmol/L     PHQ2/9: Depression screen [facility] 2/9 02/23/2021 01/01/2021 11/18/2020 09/18/2020 03/04/2020  Decreased Interest 0 1 1 3 2   Down, Depressed, Hopeless 0 1 0 0 0  PHQ - 2 Score 0 2 1 3 2   Altered sleeping - 0 3 3 3   Tired, decreased energy - 3 3 3 3   Change in appetite - 0 1 1 3   Feeling bad or failure about yourself  - 0 2 0 0  Trouble concentrating - 3 3 3 2   Moving slowly or fidgety/restless - 0 0 2 2  Suicidal thoughts - 0 0 0 0  PHQ-9 Score - 8 13 15 15   Difficult doing work/chores - - Somewhat difficult Very difficult Very difficult    phq 9 is positive  Fall Risk: Fall Risk  02/23/2021 01/01/2021 11/18/2020 09/18/2020 03/04/2020  Falls in the past year? 0 0 0 0 0  Number falls in past yr: 0 0 0 0 0  Injury with Fall?  0 0 0 0 0  Follow up - - Falls evaluation completed - -     Assessment & Plan  1. Bilateral impacted cerumen  - Ear Lavage  Verbal consent given Possible side effects discussed with patient Ears were  lavaged with warm water and peroxide  Patient tolerated procedure well No complications   2. Plantar fasciitis of left foot  Discussed home exercises, better shoe wear, insoles, consider k-laser Start meloxicam

## 2021-05-04 ENCOUNTER — Ambulatory Visit: Payer: 59 | Admitting: Podiatry

## 2021-05-10 ENCOUNTER — Ambulatory Visit (INDEPENDENT_AMBULATORY_CARE_PROVIDER_SITE_OTHER): Payer: 59

## 2021-05-10 ENCOUNTER — Other Ambulatory Visit: Payer: Self-pay

## 2021-05-10 ENCOUNTER — Encounter: Payer: Self-pay | Admitting: Podiatry

## 2021-05-10 ENCOUNTER — Ambulatory Visit (INDEPENDENT_AMBULATORY_CARE_PROVIDER_SITE_OTHER): Payer: 59 | Admitting: Podiatry

## 2021-05-10 DIAGNOSIS — M722 Plantar fascial fibromatosis: Secondary | ICD-10-CM | POA: Diagnosis not present

## 2021-05-10 MED ORDER — MELOXICAM 15 MG PO TABS: 15.0000 mg | ORAL_TABLET | Freq: Every day | ORAL | 3 refills | Status: AC

## 2021-05-10 NOTE — Patient Instructions (Signed)

## 2021-05-11 NOTE — Progress Notes (Signed)
  Subjective:  Patient ID: Bethany Marshall, female    DOB: 1991-11-09,  MRN: [id number]  Chief Complaint  Patient presents with   Foot Pain     (xray)NP - HEEL PAIN, LEFT    30 y.o. female presents with the above complaint. History confirmed with patient.   Objective:  Physical Exam: warm, good capillary refill, no trophic changes or ulcerative lesions, normal DP and PT pulses, and normal sensory exam. Left Foot: point tenderness over the heel pad   Radiographs: Multiple views x-ray of the left foot: no fracture, dislocation, swelling or degenerative changes noted and plantar calcaneal spur Assessment:   1. Plantar fasciitis of left foot      Plan:  Patient was evaluated and treated and all questions answered. Discussed the etiology and treatment options for plantar fasciitis including stretching, formal physical therapy, supportive shoegears such as a running shoe or sneaker, pre fabricated orthoses, injection therapy, and oral medications. We also discussed the role of surgical treatment of this for patients who do not improve after exhausting non-surgical treatment options.   -XR reviewed with patient -Educated patient on stretching and icing of the affected limb -Injection delivered to the plantar fascia of the left foot. -Rx for meloxicam. Educated on use, risks and benefits of the medication   Return in about 1 month (around 06/09/2021) for recheck plantar fasciitis.

## 2021-06-09 ENCOUNTER — Ambulatory Visit (INDEPENDENT_AMBULATORY_CARE_PROVIDER_SITE_OTHER): Payer: 59 | Admitting: Podiatry

## 2021-06-09 ENCOUNTER — Other Ambulatory Visit: Payer: Self-pay

## 2021-06-09 DIAGNOSIS — M722 Plantar fascial fibromatosis: Secondary | ICD-10-CM

## 2021-06-09 DIAGNOSIS — M7752 Other enthesopathy of left foot: Secondary | ICD-10-CM

## 2021-06-09 MED ORDER — METHYLPREDNISOLONE 4 MG PO TBPK: ORAL_TABLET | ORAL | 0 refills | Status: AC

## 2021-06-09 NOTE — Progress Notes (Signed)
  Subjective:  Patient ID: Bethany Marshall, female    DOB: [birth year],  MRN: [id number]  Chief Complaint  Patient presents with   Plantar Fasciitis     Return in about 1 month (around 06/09/2021) for recheck plantar fasciitis left foot    30 y.o. female returns with the above complaint. History confirmed with patient.  She was doing better until couple weeks ago when she played basketball at a family reunion  Objective:  Physical Exam: warm, good capillary refill, no trophic changes or ulcerative lesions, normal DP and PT pulses, and normal sensory exam. Left Foot: point tenderness over the heel pad, all inferior now and not in the insertion of the plantar fascia  Radiographs: Multiple views x-ray of the left foot: no fracture, dislocation, swelling or degenerative changes noted and plantar calcaneal spur Assessment:   1. Plantar fasciitis of left foot   2. Bursitis of left foot      Plan:  Patient was evaluated and treated and all questions answered. Discussed the etiology and treatment options for plantar fasciitis including stretching, formal physical therapy, supportive shoegears such as a running shoe or sneaker, pre fabricated orthoses, injection therapy, and oral medications. We also discussed the role of surgical treatment of this for patients who do not improve after exhausting non-surgical treatment options.   -I think some of this is plantar infracalcaneal bursitis.  Recommend she continue her therapy exercises.  Methylprednisolone taper sent to pharmacy she will take this and then resume her meloxicam.  Consider CAM boot in physical therapy at next visit if not improving.  Repeat injection delivered today with 2 mg of dexamethasone and 5 mg of Kenalog.    Return in about 1 month (around 07/10/2021) for recheck plantar fasciitis.

## 2021-07-14 ENCOUNTER — Ambulatory Visit: Payer: 59 | Admitting: Podiatry
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
